# Patient Record
Sex: Female | Born: 1955 | Race: White | Hispanic: No | Marital: Married | Smoking: Never smoker
Health system: Southern US, Community
[De-identification: ages and names within clinical notes are randomized; demographics above are authoritative.]

## PROBLEM LIST (undated history)

## (undated) DIAGNOSIS — G629 Polyneuropathy, unspecified: Secondary | ICD-10-CM

## (undated) DIAGNOSIS — E119 Type 2 diabetes mellitus without complications: Secondary | ICD-10-CM

## (undated) DIAGNOSIS — K219 Gastro-esophageal reflux disease without esophagitis: Secondary | ICD-10-CM

## (undated) DIAGNOSIS — F32A Depression, unspecified: Secondary | ICD-10-CM

## (undated) DIAGNOSIS — I429 Cardiomyopathy, unspecified: Secondary | ICD-10-CM

## (undated) DIAGNOSIS — F329 Major depressive disorder, single episode, unspecified: Secondary | ICD-10-CM

## (undated) DIAGNOSIS — I10 Essential (primary) hypertension: Secondary | ICD-10-CM

## (undated) DIAGNOSIS — M1990 Unspecified osteoarthritis, unspecified site: Secondary | ICD-10-CM

## (undated) DIAGNOSIS — F419 Anxiety disorder, unspecified: Secondary | ICD-10-CM

## (undated) DIAGNOSIS — I2699 Other pulmonary embolism without acute cor pulmonale: Secondary | ICD-10-CM

## (undated) HISTORY — PX: DEBRIDEMENT TENNIS ELBOW: SHX1442

## (undated) HISTORY — PX: TUBAL LIGATION: SHX77

## (undated) HISTORY — PX: BUNIONECTOMY: SHX129

## (undated) HISTORY — PX: TONSILLECTOMY: SUR1361

## (undated) HISTORY — PX: REFRACTIVE SURGERY: SHX103

## (undated) HISTORY — PX: FOOT BONE EXCISION: SUR493

---

## 2002-12-21 ENCOUNTER — Encounter: Payer: Self-pay | Admitting: Physical Medicine and Rehabilitation

## 2010-08-01 DIAGNOSIS — I429 Cardiomyopathy, unspecified: Secondary | ICD-10-CM

## 2010-08-01 HISTORY — DX: Cardiomyopathy, unspecified: I42.9

## 2010-10-13 ENCOUNTER — Encounter: Payer: BC Managed Care – PPO | Attending: Endocrinology | Admitting: *Deleted

## 2010-10-13 DIAGNOSIS — Z713 Dietary counseling and surveillance: Secondary | ICD-10-CM | POA: Insufficient documentation

## 2010-10-13 DIAGNOSIS — E119 Type 2 diabetes mellitus without complications: Secondary | ICD-10-CM | POA: Insufficient documentation

## 2012-10-16 ENCOUNTER — Other Ambulatory Visit: Payer: Self-pay | Admitting: Podiatry

## 2012-10-25 ENCOUNTER — Encounter (HOSPITAL_COMMUNITY)
Admission: RE | Disposition: A | Discharge status: Home / Self Care | Payer: Self-pay | Source: Ambulatory Visit | Attending: Podiatry

## 2012-10-25 ENCOUNTER — Ambulatory Visit (HOSPITAL_COMMUNITY)
Admission: RE | Admit: 2012-10-25 | Discharge: 2012-10-25 | Disposition: A | Discharge status: Home / Self Care | Payer: BC Managed Care – PPO | Source: Ambulatory Visit | Attending: Podiatry | Admitting: Podiatry

## 2012-10-25 DIAGNOSIS — M2040 Other hammer toe(s) (acquired), unspecified foot: Secondary | ICD-10-CM | POA: Insufficient documentation

## 2012-10-25 DIAGNOSIS — M2041 Other hammer toe(s) (acquired), right foot: Secondary | ICD-10-CM

## 2012-10-25 DIAGNOSIS — E1149 Type 2 diabetes mellitus with other diabetic neurological complication: Secondary | ICD-10-CM | POA: Insufficient documentation

## 2012-10-25 DIAGNOSIS — L97509 Non-pressure chronic ulcer of other part of unspecified foot with unspecified severity: Secondary | ICD-10-CM | POA: Insufficient documentation

## 2012-10-25 DIAGNOSIS — E1142 Type 2 diabetes mellitus with diabetic polyneuropathy: Secondary | ICD-10-CM | POA: Insufficient documentation

## 2012-10-25 DIAGNOSIS — A5211 Tabes dorsalis: Secondary | ICD-10-CM | POA: Insufficient documentation

## 2012-10-25 SURGERY — TENOTOMY, FLEXOR
Anesthesia: LOCAL | Laterality: Right

## 2012-10-25 MED ORDER — CLINDAMYCIN PHOSPHATE 600 MG/50ML IV SOLN
INTRAVENOUS | Status: AC
  Filled 2012-10-25: qty 50

## 2012-10-25 MED ORDER — CLINDAMYCIN PHOSPHATE 600 MG/50ML IV SOLN
600.0000 mg | Freq: Once | INTRAVENOUS | Status: AC
  Administered 2012-10-25: 600 mg via INTRAVENOUS

## 2012-10-25 MED ORDER — LACTATED RINGERS IV SOLN
INTRAVENOUS | Status: DC
  Administered 2012-10-25: 09:00:00 via INTRAVENOUS

## 2012-10-25 MED ORDER — BUPIVACAINE HCL (PF) 0.5 % IJ SOLN
INTRAMUSCULAR | Status: AC
  Filled 2012-10-25: qty 30

## 2012-10-25 SURGICAL SUPPLY — 36 items
APL SKNCLS STERI-STRIP NONHPOA (GAUZE/BANDAGES/DRESSINGS)
BAG HAMPER (MISCELLANEOUS) ×1 IMPLANT
BANDAGE ELASTIC 4 VELCRO NS (GAUZE/BANDAGES/DRESSINGS) ×2 IMPLANT
BANDAGE ESMARK 4X12 BL STRL LF (DISPOSABLE) ×1 IMPLANT
BANDAGE GAUZE ELAST BULKY 4 IN (GAUZE/BANDAGES/DRESSINGS) ×2 IMPLANT
BENZOIN TINCTURE PRP APPL 2/3 (GAUZE/BANDAGES/DRESSINGS) ×1 IMPLANT
BLADE SURG 15 STRL LF DISP TIS (BLADE) ×1 IMPLANT
BLADE SURG 15 STRL SS (BLADE)
BNDG CMPR 12X4 ELC STRL LF (DISPOSABLE) ×1
BNDG ESMARK 4X12 BLUE STRL LF (DISPOSABLE) ×2
CHLORAPREP W/TINT 26ML (MISCELLANEOUS) ×1 IMPLANT
CLOTH BEACON ORANGE TIMEOUT ST (SAFETY) ×1 IMPLANT
COVER LIGHT HANDLE STERIS (MISCELLANEOUS) ×2 IMPLANT
CUFF TOURN SGL LL 12 (TOURNIQUET CUFF) ×2 IMPLANT
CUFF TOURNIQUET SINGLE 18IN (TOURNIQUET CUFF) IMPLANT
DECANTER SPIKE VIAL GLASS SM (MISCELLANEOUS) ×2 IMPLANT
DRSG ADAPTIC 3X8 NADH LF (GAUZE/BANDAGES/DRESSINGS) ×2 IMPLANT
ELECT REM PT RETURN 9FT ADLT (ELECTROSURGICAL) ×2
ELECTRODE REM PT RTRN 9FT ADLT (ELECTROSURGICAL) ×1 IMPLANT
GLOVE BIO SURGEON STRL SZ7.5 (GLOVE) ×2 IMPLANT
GOWN STRL REIN XL XLG (GOWN DISPOSABLE) ×3 IMPLANT
KIT ROOM TURNOVER AP CYSTO (KITS) ×1 IMPLANT
MANIFOLD NEPTUNE II (INSTRUMENTS) ×1 IMPLANT
NDL HYPO 27GX1-1/4 (NEEDLE) ×2 IMPLANT
NEEDLE HYPO 27GX1-1/4 (NEEDLE) ×2 IMPLANT
NS IRRIG 1000ML POUR BTL (IV SOLUTION) ×2 IMPLANT
PACK BASIC LIMB (CUSTOM PROCEDURE TRAY) ×1 IMPLANT
PAD ARMBOARD 7.5X6 YLW CONV (MISCELLANEOUS) ×1 IMPLANT
SET BASIN LINEN APH (SET/KITS/TRAYS/PACK) ×2 IMPLANT
SPONGE GAUZE 4X4 12PLY (GAUZE/BANDAGES/DRESSINGS) ×2 IMPLANT
SPONGE LAP 18X18 X RAY DECT (DISPOSABLE) ×2 IMPLANT
STRIP CLOSURE SKIN 1/2X4 (GAUZE/BANDAGES/DRESSINGS) ×4 IMPLANT
SUT PROLENE 4 0 PS 2 18 (SUTURE) ×1 IMPLANT
SUT SILK 4 0 (SUTURE) ×2
SUT SILK 4-0 18XBRD TIE 12 (SUTURE) IMPLANT
SYR CONTROL 10ML LL (SYRINGE) ×2 IMPLANT

## 2012-10-25 NOTE — Op Note (Signed)
OPERATIVE NOTE  DATE OF PROCEDURE:  10/25/2012  SURGEON:   Dallas Schimke, DPM  PREOPERATIVE DIAGNOSIS:   1.  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  Diabetes mellitus with peripheral neuropathy  POSTOPERATIVE DIAGNOSIS: Same  PROCEDURE: 1.  Percutaneous flexor tenotomy 3rd digit right foot 2.  Percutaneous flexor tenotomy 4th digit right foot  ANESTHESIA:  Local   HEMOSTASIS:   Pneumatic ankle tourniquet set at 250 mmHg  ESTIMATED BLOOD LOSS:   Minimal (<5 cc)  MATERIALS USED:  None  INJECTABLES: Marcaine 0.5% plain; 6mL  PATHOLOGY:   None  COMPLICATIONS:   None  INDICATIONS:  Chronic, non-healing ulcerations along the distal aspects of the 3rd digit and 4th digit of the right foot.  The ulceration has failed to remain healed with local wound care and offloading.  DESCRIPTION OF THE PROCEDURE:   The patient was brought to the minor procedure room and placed on the operative table in the supine position.  A pneumatic ankle tourniquet was applied to the patient's ankle.  The surgical site was anesthetized with 0.5% Marcaine plain.  The foot was then prepped, scrubbed, and draped in the usual sterile technique.  The foot was elevated, exsanguinated and the pneumatic ankle tourniquet inflated to 250 mmHg.    Attention was directed to the plantar aspect of the third digit of the right foot.  An incision was made plantar to the DIPJ of the digit.  The flexor tenotomy was identified and transected.  The contracture noted on the distal aspect of the toe preoperatively reduced.  The surgical site was irrigated.  The incision was reapproximated using 4-0 Prolene in a simple suture technique.  Attention was directed to the fourth toe of the right foot.  The same incision was performed to the fourth toe of the right foot that was performed of the third toe of the right foot.  A sterile dressing was applied  to the operative foot.  The pneumatic ankle tourniquet was deflated and a prompt hyperemic response was noted to all digits of the operative foot.    The patient tolerated the procedure well.  The patient was then transferred to Short Stay with vital signs stable and vascular status intact to all toes of the operative foot.  Following a period of postoperative monitoring, the patient will be discharged home.

## 2012-10-29 ENCOUNTER — Encounter (HOSPITAL_COMMUNITY): Payer: Self-pay | Admitting: Anesthesiology

## 2013-07-17 ENCOUNTER — Other Ambulatory Visit: Payer: Self-pay | Admitting: Podiatry

## 2013-07-17 ENCOUNTER — Encounter (HOSPITAL_COMMUNITY): Payer: Self-pay

## 2013-07-17 ENCOUNTER — Ambulatory Visit (HOSPITAL_COMMUNITY)
Admission: RE | Admit: 2013-07-17 | Discharge: 2013-07-17 | Disposition: A | Discharge status: Home / Self Care | Payer: BC Managed Care – PPO | Source: Ambulatory Visit | Attending: Podiatry | Admitting: Podiatry

## 2013-07-17 ENCOUNTER — Encounter (HOSPITAL_COMMUNITY)
Admission: RE | Admit: 2013-07-17 | Discharge: 2013-07-17 | Disposition: A | Discharge status: Home / Self Care | Payer: BC Managed Care – PPO | Source: Ambulatory Visit | Attending: Podiatry | Admitting: Podiatry

## 2013-07-17 HISTORY — DX: Depression, unspecified: F32.A

## 2013-07-17 HISTORY — DX: Unspecified osteoarthritis, unspecified site: M19.90

## 2013-07-17 HISTORY — DX: Gastro-esophageal reflux disease without esophagitis: K21.9

## 2013-07-17 HISTORY — DX: Major depressive disorder, single episode, unspecified: F32.9

## 2013-07-17 HISTORY — DX: Anxiety disorder, unspecified: F41.9

## 2013-07-17 HISTORY — DX: Polyneuropathy, unspecified: G62.9

## 2013-07-17 HISTORY — DX: Essential (primary) hypertension: I10

## 2013-07-17 HISTORY — DX: Type 2 diabetes mellitus without complications: E11.9

## 2013-07-17 LAB — HEMOGLOBIN AND HEMATOCRIT, BLOOD
HCT: 38.2 % (ref 36.0–46.0)
Hemoglobin: 12.5 g/dL (ref 12.0–15.0)

## 2013-07-17 LAB — BASIC METABOLIC PANEL
Calcium: 9.4 mg/dL (ref 8.4–10.5)
GFR calc Af Amer: 70 mL/min — ABNORMAL LOW (ref >90)
GFR calc non Af Amer: 61 mL/min — ABNORMAL LOW (ref >90)
Sodium: 137 mEq/L (ref 135–145)

## 2013-07-17 NOTE — Patient Instructions (Signed)
Natalie Gray  07/17/2013  Your procedure is scheduled on:  07/18/2013  Report to [facility] at  1220 PM.  Call this number if you have problems the morning of surgery: [phone]   Remember:   Do not eat food or drink liquids after midnight.   Take these medicines the morning of surgery with A SIP OF WATER: xanax, cymbalta, norco, wellbutrin, coreg, celebrex, lisinopril, prilosec, topamax   Do not wear jewelry, make-up or nail polish.  Do not wear lotions, powders, or perfumes.   Do not shave 48 hours prior to surgery. Men may shave face and neck.  Do not bring valuables to the hospital.  [facility] is not responsible for any belongings or valuables.               Contacts, dentures or bridgework may not be worn into surgery.  Leave suitcase in the car. After surgery it may be brought to your room.  For patients admitted to the hospital, discharge time is determined by your treatment team.               Patients discharged the day of surgery will not be allowed to drive home.  Name and phone number of your driver: family  Special Instructions: Shower using CHG 2 nights before surgery and the night before surgery.  If you shower the day of surgery use CHG.  Use special wash - you have one bottle of CHG for all showers.  You should use approximately 1/3 of the bottle for each shower.   Please read over the following fact sheets that you were given: Pain Booklet, Coughing and Deep Breathing, MRSA Information, Surgical Site Infection Prevention, Anesthesia Post-op Instructions and Care and Recovery After Surgery Amputation Many new amputations occur each year. The most common causes of amputation of the lower extremity (the hip down) are:  Disease.  Injury caused in an accidents or wars (trauma).  Birth defects.  Lumps (tumors) that are cancer. Upper extremity amputation is usually the result of trauma or birth defect, with disease being a less common cause. COMMON  PROBLEMS After an amputation a number of issues need to be considered. Getting around and self-care are early problems that must be dealt with. A complete rehabilitation program will help the amputee recover mobility. A team approach of caregivers helps the most. Caregivers that can provide a well rounded program include:   Physicians.  Therapists.  Nurses.  Social workers.  Psychologists. Usually there are problems with body image and coping with lifestyle changes. A grieving period similar to dealing with a death in the family is common after an amputation. Talking to a trained professional with experience in treating people with similar problems can be very helpful. When returning to a previous lifestyle, questions about sexuality can arise. Many of these uncertainties are normal. These can be discussed with your psychologist or rehabilitation specialist. REHABILITATION AND RETURN TO WORK AND ACTIVITIES Returning to recreational activities and employment are part of recovery. Many times, changes to recreation equipment can allow return to a sport or hobby. A device that substitutes the missing part of the body is called a prosthetic. Many prosthetic manufacturers produce components designed for sports. Be sure to discuss all of your leisure interests with your prosthetist. This is the person who helps provide you with custom made replacement limbs. Your physician will also help to select a prosthetic that will meet your needs. Employers will vary in their willingness  to change a work environment in order to help people with disabilities. Your therapists can perform job site evaluations. Your therapist can then make recommendations to help with your work area. Some amputees will not be able to return to previous jobs. Your local Office of Vocational Rehabilitation can assist you in job retraining.  Once you are past the initial rehabilitation stage you will have ongoing contact with caregivers and  a prosthetist. You need to work closely with them in making decisions about your prosthetic device. PROGNOSIS  Amputation should not end your joy of life. There are people with limb loss in nearly all walks of life. They are in a wide variety of professions. They participate in nearly all sports. Ask your caregivers about support groups and sports organizations in your area. They can help you with referral to organizations that will be helpful to you. Document Released: 04/09/2002 Document Revised: 10/10/2011 Document Reviewed: 06/04/2007 [facility] Patient Information 2014 [place], [place]. PATIENT INSTRUCTIONS POST-ANESTHESIA  IMMEDIATELY FOLLOWING SURGERY:  Do not drive or operate machinery for the first twenty four hours after surgery.  Do not make any important decisions for twenty four hours after surgery or while taking narcotic pain medications or sedatives.  If you develop intractable nausea and vomiting or a severe headache please notify your doctor immediately.  FOLLOW-UP:  Please make an appointment with your surgeon as instructed. You do not need to follow up with anesthesia unless specifically instructed to do so.  WOUND CARE INSTRUCTIONS (if applicable):  Keep a dry clean dressing on the anesthesia/puncture wound site if there is drainage.  Once the wound has quit draining you may leave it open to air.  Generally you should leave the bandage intact for twenty four hours unless there is drainage.  If the epidural site drains for more than 36-48 hours please call the anesthesia department.  QUESTIONS?:  Please feel free to call your physician or the hospital operator if you have any questions, and they will be happy to assist you.

## 2013-07-18 ENCOUNTER — Ambulatory Visit (HOSPITAL_COMMUNITY): Payer: BC Managed Care – PPO

## 2013-07-18 ENCOUNTER — Encounter (HOSPITAL_COMMUNITY)
Admission: RE | Disposition: A | Discharge status: Home / Self Care | Payer: Self-pay | Source: Ambulatory Visit | Attending: Podiatry

## 2013-07-18 ENCOUNTER — Encounter (HOSPITAL_COMMUNITY): Payer: BC Managed Care – PPO | Admitting: Anesthesiology

## 2013-07-18 ENCOUNTER — Encounter (HOSPITAL_COMMUNITY): Payer: Self-pay | Admitting: *Deleted

## 2013-07-18 ENCOUNTER — Ambulatory Visit (HOSPITAL_COMMUNITY): Payer: BC Managed Care – PPO | Admitting: Anesthesiology

## 2013-07-18 ENCOUNTER — Ambulatory Visit (HOSPITAL_COMMUNITY)
Admission: RE | Admit: 2013-07-18 | Discharge: 2013-07-18 | Disposition: A | Discharge status: Home / Self Care | Payer: BC Managed Care – PPO | Source: Ambulatory Visit | Attending: Podiatry | Admitting: Podiatry

## 2013-07-18 DIAGNOSIS — Z01810 Encounter for preprocedural cardiovascular examination: Secondary | ICD-10-CM | POA: Insufficient documentation

## 2013-07-18 DIAGNOSIS — Z79899 Other long term (current) drug therapy: Secondary | ICD-10-CM | POA: Insufficient documentation

## 2013-07-18 DIAGNOSIS — T8489XA Other specified complication of internal orthopedic prosthetic devices, implants and grafts, initial encounter: Secondary | ICD-10-CM | POA: Insufficient documentation

## 2013-07-18 DIAGNOSIS — Y838 Other surgical procedures as the cause of abnormal reaction of the patient, or of later complication, without mention of misadventure at the time of the procedure: Secondary | ICD-10-CM | POA: Insufficient documentation

## 2013-07-18 DIAGNOSIS — Z01812 Encounter for preprocedural laboratory examination: Secondary | ICD-10-CM | POA: Insufficient documentation

## 2013-07-18 DIAGNOSIS — Z472 Encounter for removal of internal fixation device: Secondary | ICD-10-CM | POA: Insufficient documentation

## 2013-07-18 DIAGNOSIS — I10 Essential (primary) hypertension: Secondary | ICD-10-CM | POA: Insufficient documentation

## 2013-07-18 DIAGNOSIS — IMO0002 Reserved for concepts with insufficient information to code with codable children: Secondary | ICD-10-CM

## 2013-07-18 DIAGNOSIS — E119 Type 2 diabetes mellitus without complications: Secondary | ICD-10-CM | POA: Insufficient documentation

## 2013-07-18 DIAGNOSIS — L97509 Non-pressure chronic ulcer of other part of unspecified foot with unspecified severity: Secondary | ICD-10-CM | POA: Insufficient documentation

## 2013-07-18 HISTORY — PX: FOOT ARTHRODESIS: SHX1655

## 2013-07-18 HISTORY — PX: AMPUTATION: SHX166

## 2013-07-18 LAB — GLUCOSE, CAPILLARY: Glucose-Capillary: 104 mg/dL — ABNORMAL HIGH (ref 70–99)

## 2013-07-18 SURGERY — AMPUTATION DIGIT
Anesthesia: Monitor Anesthesia Care | Site: Foot | Laterality: Right

## 2013-07-18 MED ORDER — CLINDAMYCIN PHOSPHATE 600 MG/50ML IV SOLN: 600.0000 mg | Freq: Once | INTRAVENOUS | Status: DC

## 2013-07-18 MED ORDER — LIDOCAINE HCL (PF) 1 % IJ SOLN
INTRAMUSCULAR | Status: AC
  Filled 2013-07-18: qty 2

## 2013-07-18 MED ORDER — MIDAZOLAM HCL 2 MG/2ML IJ SOLN
INTRAMUSCULAR | Status: AC
  Filled 2013-07-18: qty 2

## 2013-07-18 MED ORDER — PROPOFOL 10 MG/ML IV BOLUS
INTRAVENOUS | Status: AC
  Filled 2013-07-18: qty 20

## 2013-07-18 MED ORDER — ONDANSETRON HCL 4 MG/2ML IJ SOLN: 4.0000 mg | Freq: Once | INTRAMUSCULAR | Status: DC | PRN

## 2013-07-18 MED ORDER — LACTATED RINGERS IV SOLN
INTRAVENOUS | Status: DC
  Administered 2013-07-18: 16:00:00 via INTRAVENOUS
  Administered 2013-07-18: 1000 mL via INTRAVENOUS

## 2013-07-18 MED ORDER — ONDANSETRON HCL 4 MG/2ML IJ SOLN
4.0000 mg | Freq: Once | INTRAMUSCULAR | Status: AC
  Administered 2013-07-18: 4 mg via INTRAVENOUS

## 2013-07-18 MED ORDER — BUPIVACAINE HCL (PF) 0.5 % IJ SOLN
INTRAMUSCULAR | Status: AC
  Filled 2013-07-18: qty 30

## 2013-07-18 MED ORDER — MIDAZOLAM HCL 2 MG/2ML IJ SOLN
1.0000 mg | INTRAMUSCULAR | Status: DC | PRN
  Administered 2013-07-18: 2 mg via INTRAVENOUS

## 2013-07-18 MED ORDER — BUPIVACAINE HCL (PF) 0.5 % IJ SOLN
INTRAMUSCULAR | Status: DC | PRN
  Administered 2013-07-18: 15 mL

## 2013-07-18 MED ORDER — CLINDAMYCIN PHOSPHATE 600 MG/50ML IV SOLN
600.0000 mg | Freq: Once | INTRAVENOUS | Status: AC
  Administered 2013-07-18: 600 mg via INTRAVENOUS
  Filled 2013-07-18: qty 50

## 2013-07-18 MED ORDER — ONDANSETRON HCL 4 MG/2ML IJ SOLN
INTRAMUSCULAR | Status: AC
  Filled 2013-07-18: qty 2

## 2013-07-18 MED ORDER — 0.9 % SODIUM CHLORIDE (POUR BTL) OPTIME
TOPICAL | Status: DC | PRN
  Administered 2013-07-18: 1000 mL

## 2013-07-18 MED ORDER — FENTANYL CITRATE 0.05 MG/ML IJ SOLN
INTRAMUSCULAR | Status: AC
  Filled 2013-07-18: qty 2

## 2013-07-18 MED ORDER — FENTANYL CITRATE 0.05 MG/ML IJ SOLN: 25.0000 ug | INTRAMUSCULAR | Status: DC | PRN

## 2013-07-18 MED ORDER — LIDOCAINE HCL (PF) 1 % IJ SOLN
INTRAMUSCULAR | Status: AC
  Filled 2013-07-18: qty 5

## 2013-07-18 MED ORDER — LIDOCAINE HCL (CARDIAC) 10 MG/ML IV SOLN
INTRAVENOUS | Status: DC | PRN
  Administered 2013-07-18: 10 mg via INTRAVENOUS

## 2013-07-18 MED ORDER — FENTANYL CITRATE 0.05 MG/ML IJ SOLN
INTRAMUSCULAR | Status: DC | PRN
  Administered 2013-07-18 (×2): 25 ug via INTRAVENOUS

## 2013-07-18 MED ORDER — FENTANYL CITRATE 0.05 MG/ML IJ SOLN
25.0000 ug | INTRAMUSCULAR | Status: AC
  Administered 2013-07-18 (×2): 25 ug via INTRAVENOUS

## 2013-07-18 MED ORDER — PROPOFOL INFUSION 10 MG/ML OPTIME
INTRAVENOUS | Status: DC | PRN
  Administered 2013-07-18: 100 ug/kg/min via INTRAVENOUS
  Administered 2013-07-18 (×2): via INTRAVENOUS

## 2013-07-18 SURGICAL SUPPLY — 55 items
APL SKNCLS STERI-STRIP NONHPOA (GAUZE/BANDAGES/DRESSINGS) ×1
BAG HAMPER (MISCELLANEOUS) ×2 IMPLANT
BANDAGE CONFORM 2  STR LF (GAUZE/BANDAGES/DRESSINGS) ×2 IMPLANT
BANDAGE ELASTIC 4 VELCRO NS (GAUZE/BANDAGES/DRESSINGS) ×2 IMPLANT
BANDAGE ESMARK 4X12 BL STRL LF (DISPOSABLE) ×1 IMPLANT
BANDAGE GAUZE ELAST BULKY 4 IN (GAUZE/BANDAGES/DRESSINGS) ×2 IMPLANT
BENZOIN TINCTURE PRP APPL 2/3 (GAUZE/BANDAGES/DRESSINGS) ×2 IMPLANT
BLADE AVERAGE 25X9 (BLADE) ×2 IMPLANT
BLADE SURG 15 STRL LF DISP TIS (BLADE) ×2 IMPLANT
BLADE SURG 15 STRL SS (BLADE) ×4
BNDG CMPR 12X4 ELC STRL LF (DISPOSABLE) ×1
BNDG ESMARK 4X12 BLUE STRL LF (DISPOSABLE) ×2
CHLORAPREP W/TINT 26ML (MISCELLANEOUS) ×1 IMPLANT
CLOTH BEACON ORANGE TIMEOUT ST (SAFETY) ×2 IMPLANT
COVER LIGHT HANDLE STERIS (MISCELLANEOUS) ×4 IMPLANT
CUFF TOURNIQUET SINGLE 18IN (TOURNIQUET CUFF) ×2 IMPLANT
DECANTER SPIKE VIAL GLASS SM (MISCELLANEOUS) ×2 IMPLANT
DRAPE OEC MINIVIEW 54X84 (DRAPES) ×2 IMPLANT
DRSG ADAPTIC 3X8 NADH LF (GAUZE/BANDAGES/DRESSINGS) ×2 IMPLANT
DURA STEPPER LG (CAST SUPPLIES) IMPLANT
DURA STEPPER MED (CAST SUPPLIES) ×1 IMPLANT
DURA STEPPER SML (CAST SUPPLIES) IMPLANT
DURA STEPPER XL (SOFTGOODS) IMPLANT
ELECT REM PT RETURN 9FT ADLT (ELECTROSURGICAL) ×2
ELECTRODE REM PT RTRN 9FT ADLT (ELECTROSURGICAL) ×1 IMPLANT
GLOVE BIO SURGEON STRL SZ7.5 (GLOVE) ×2 IMPLANT
GLOVE EXAM NITRILE MD LF STRL (GLOVE) ×1 IMPLANT
GLOVE INDICATOR 6.5 STRL GRN (GLOVE) ×1 IMPLANT
GLOVE INDICATOR 7.0 STRL GRN (GLOVE) ×2 IMPLANT
GLOVE SS BIOGEL STRL SZ 6.5 (GLOVE) IMPLANT
GLOVE SUPERSENSE BIOGEL SZ 6.5 (GLOVE) ×1
GOWN STRL REIN XL XLG (GOWN DISPOSABLE) ×6 IMPLANT
K-WIRE NON THREAD 1.1 (WIRE) ×2
KIT ROOM TURNOVER AP CYSTO (KITS) ×2 IMPLANT
KIT ROOM TURNOVER APOR (KITS) ×2 IMPLANT
KWIRE NON THREAD 1.1 (WIRE) IMPLANT
MANIFOLD NEPTUNE II (INSTRUMENTS) ×2 IMPLANT
NDL HYPO 27GX1-1/4 (NEEDLE) ×3 IMPLANT
NEEDLE HYPO 27GX1-1/4 (NEEDLE) ×6 IMPLANT
NS IRRIG 1000ML POUR BTL (IV SOLUTION) ×2 IMPLANT
PACK BASIC LIMB (CUSTOM PROCEDURE TRAY) ×2 IMPLANT
PAD ARMBOARD 7.5X6 YLW CONV (MISCELLANEOUS) ×2 IMPLANT
RASP SM TEAR CROSS CUT (RASP) IMPLANT
SET BASIN LINEN APH (SET/KITS/TRAYS/PACK) ×2 IMPLANT
SOL PREP PROV IODINE SCRUB 4OZ (MISCELLANEOUS) ×2 IMPLANT
SPONGE GAUZE 4X4 12PLY (GAUZE/BANDAGES/DRESSINGS) ×2 IMPLANT
SPONGE LAP 18X18 X RAY DECT (DISPOSABLE) ×2 IMPLANT
STAPLE FIXATION 8X8X8 (Staple) ×2 IMPLANT
STRIP CLOSURE SKIN 1/2X4 (GAUZE/BANDAGES/DRESSINGS) ×3 IMPLANT
SUT ETHILON 4 0 PS 2 18 (SUTURE) IMPLANT
SUT PROLENE 4 0 PS 2 18 (SUTURE) ×4 IMPLANT
SUT VIC AB 2-0 CT2 27 (SUTURE) ×1 IMPLANT
SUT VIC AB 4-0 PS2 27 (SUTURE) ×2 IMPLANT
SUT VICRYL AB 3-0 FS1 BRD 27IN (SUTURE) ×1 IMPLANT
SYR CONTROL 10ML LL (SYRINGE) ×4 IMPLANT

## 2013-07-18 NOTE — H&P (Signed)
HISTORY AND PHYSICAL INTERVAL NOTE:  07/18/2013  1:55 PM  Natalie Gray  has presented today for surgery, with the diagnosis of ulceration 2nd toe right foot, non-union IPJ right hallux.  The various methods of treatment have been discussed with the patient.  No guarantees were given.  After consideration of risks, benefits and other options for treatment, the patient has consented to surgery.  I have reviewed the patients' chart and labs.    Patient Vitals for the past 24 hrs:  BP Temp Temp src Pulse Resp SpO2  07/18/13 1325 109/72 mmHg - - - 20 98 %  07/18/13 1320 118/76 mmHg - - - 15 99 %  07/18/13 1315 117/75 mmHg - - - 18 99 %  07/18/13 1310 110/75 mmHg - - - 16 97 %  07/18/13 1305 115/78 mmHg - - - 17 100 %  07/18/13 1300 120/72 mmHg - - - 18 99 %  07/18/13 1255 113/76 mmHg - - - 21 100 %  07/18/13 1250 117/76 mmHg - - - 14 97 %  07/18/13 1245 105/61 mmHg - - - 17 99 %  07/18/13 1240 112/71 mmHg - - - 14 96 %  07/18/13 1235 114/76 mmHg 97.6 F (36.4 C) Oral 64 20 100 %    A history and physical examination was performed in my office.  The patient was reexamined.  There have been no changes to this history and physical examination.  Dallas Schimke, DPM

## 2013-07-18 NOTE — Anesthesia Preprocedure Evaluation (Signed)
Anesthesia Evaluation  Patient identified by MRN, date of birth, ID band Patient awake    Reviewed: Allergy & Precautions, H&P , NPO status , Patient's Chart, lab work & pertinent test results, reviewed documented beta blocker date and time   Airway Mallampati: II  TM Distance: >3 FB     Dental  (+) Teeth Intact   Pulmonary neg pulmonary ROS,  Hx pulm emboli, resolved. Not anticoagulated now. breath sounds clear to auscultation        Cardiovascular hypertension, Pt. on medications and Pt. on home beta blockers +CHF (cardiomyopathy of unknown etiology, EF recovered from 10% to 45% rently.) and DVT Rhythm:Regular Rate:Normal     Neuro/Psych PSYCHIATRIC DISORDERS Anxiety Depression    GI/Hepatic GERD-  Controlled and Medicated,  Endo/Other  diabetes, Well Controlled, Type 2, Oral Hypoglycemic Agents  Renal/GU      Musculoskeletal   Abdominal   Peds  Hematology   Anesthesia Other Findings   Reproductive/Obstetrics                             Anesthesia Physical Anesthesia Plan  ASA: III  Anesthesia Plan: MAC   Post-op Pain Management:    Induction: Intravenous  Airway Management Planned: Nasal Cannula  Additional Equipment:   Intra-op Plan:   Post-operative Plan:   Informed Consent: I have reviewed the patients History and Physical, chart, labs and discussed the procedure including the risks, benefits and alternatives for the proposed anesthesia with the patient or authorized representative who has indicated his/her understanding and acceptance.     Plan Discussed with:   Anesthesia Plan Comments:         Anesthesia Quick Evaluation  

## 2013-07-18 NOTE — Op Note (Signed)
OPERATIVE NOTE  DATE OF PROCEDURE:  07/18/2013  SURGEON:   Dallas Schimke, DPM  OR STAFF:   Circulator: Eliane Decree Page, RN Scrub Person: Hurshel Party, CST Circulator Assistant: Lennox Pippins, RN RN First Assistant: Lizabeth Leyden, RN   PREOPERATIVE DIAGNOSIS:   1.  Ulceration 2nd toe, right foot. 2.  Retained hardware of the hallux, right foot. 3.  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 4.  Diabetes mellitus with peripheral neuropathy.  POSTOPERATIVE DIAGNOSIS: Same  PROCEDURE: 1.  Amputation of the 2nd toe at the metatarsophalangeal joint, right foot. 2.  Removal of hardware of the hallux, right foot. 3.  Repair of non-union of the IPJ of the hallux, right foot.  ANESTHESIA:  Monitor Anesthesia Care   HEMOSTASIS:   Pneumatic ankle tourniquet set at 250 mmHg  ESTIMATED BLOOD LOSS:   Minimal (<5 cc)  MATERIALS USED:  Stryker staple  INJECTABLES: Marcaine 0.5% plain; 15mL  PATHOLOGY:   Right 2nd toe was provided to the patient at her request for burial.    COMPLICATIONS:   None  INDICATIONS:  Chronic non-healing wound of the right 2nd toe.  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  DESCRIPTION OF THE PROCEDURE:   The patient was brought to the operating room and placed on the operative table in the supine position.  A pneumatic ankle tourniquet was applied to the patient's ankle.  Following sedation, the surgical site was anesthetized with 0.5% Marcaine plain.  The foot was then prepped, scrubbed, and draped in the usual sterile technique.  The foot was elevated, exsanguinated and the pneumatic ankle tourniquet inflated to 250 mmHg.    Attention was directed to the second toe of the right foot.  A full-thickness ulceration was noted along the medial aspect of the second toe of the right foot.  Two converging semielliptical incisions were made encompassing the second toe in its entirety.  Dissection was continued deep down to the level of  the second metatarsophalangeal joint.  The second metatarsophalangeal joint was disarticulated and the second toe was removed and passed from the operative field.  It was provided to the patient in a sterile cup for burial as requested.  The wound was irrigated with copious amounts of sterile irrigant.  Subcutaneous structures were reapproximated using 4-0 Vicryl running simple suture technique.  The skin was reapproximated using 4-0 Prolene in a simple suture and horizontal mattress simple suture technique.  Attention was directed to the distal aspect of the right hallux where a full-thickness ulceration was present.  2 converging semi-optical incisions were performed encompassing the ulceration in its entirety.  This wedge of skin was removed and passed from the operative field.  Dissection was continued deep down to the level of the distal phalanx.  The screw crossing the interphalangeal joint of the hallux was identified.  It was removed and passed from the operative field.  Attention was then directed to the dorsal aspect of the right interphalangeal joint of the right hallux.  A linear longitudinal incision was made.  Dissection was continued deep down to the level of the interphalangeal joint.  Significant fibrous tissue was identified at the interphalangeal joint consistent with a nonunion site.  This was resected using rongeur and power saw.  The distal phalanx and proximal phalanx were debrided to a bleeding base.  A staple was placed along the lateral aspect of the interphalangeal joint.  However, this did not provide adequate compression and pulled  through the distal phalanx.  This was removed and passed from the operative field.  A second staple was inserted across the medial portion of the interphalangeal joint.  Good compression was identified.  The wound was irrigated with copious amounts sterile irrigant.  The subcutaneous structures reapproximated using 4-0 Vicryl in a simple suture technique.   The skin was reapproximated using 4-0 Prolene in a simple suture technique.  A sterile compressive dressing was applied to the right foot.  The pneumatic ankle tourniquet was deflated and a prompt hyperemic response was noted to all remaining digits of the right foot.  The patient tolerated the procedure well.  The patient was then transferred to PACU with vital signs stable and vascular status intact to all toes of the operative foot.  Following a period of postoperative monitoring, the patient will be discharged home.

## 2013-07-18 NOTE — Anesthesia Postprocedure Evaluation (Signed)
  Anesthesia Post-op Note  Patient: Natalie Gray  Procedure(s) Performed: Procedure(s): AMPUTATION 2ND TOE RIGHT FOOT  (Right) REPAIR IPJ NON-UNION RIGHT HALLUX (Right)  Patient Location: PACU  Anesthesia Type:MAC  Level of Consciousness: awake, alert  and oriented  Airway and Oxygen Therapy: Patient Spontanous Breathing  Post-op Pain: none  Post-op Assessment: Post-op Vital signs reviewed, Patient's Cardiovascular Status Stable, Respiratory Function Stable, Patent Airway and No signs of Nausea or vomiting  Post-op Vital Signs: Reviewed and stable  Complications: No apparent anesthesia complications

## 2013-07-18 NOTE — Anesthesia Procedure Notes (Signed)
Procedure Name: MAC Date/Time: 07/18/2013 2:15 PM Performed by: Franco Nones Pre-anesthesia Checklist: Patient identified, Emergency Drugs available, Suction available, Timeout performed and Patient being monitored Patient Re-evaluated:Patient Re-evaluated prior to inductionOxygen Delivery Method: Non-rebreather mask

## 2013-07-18 NOTE — Transfer of Care (Signed)
Immediate Anesthesia Transfer of Care Note  Patient: Natalie Gray  Procedure(s) Performed: Procedure(s): AMPUTATION 2ND TOE RIGHT FOOT  (Right) REPAIR IPJ NON-UNION RIGHT HALLUX (Right)  Patient Location: PACU  Anesthesia Type:MAC  Level of Consciousness: awake, alert  and oriented  Airway & Oxygen Therapy: Patient Spontanous Breathing  Post-op Assessment: Report given to PACU RN  Post vital signs: Reviewed  Complications: No apparent anesthesia complications

## 2013-07-18 NOTE — Addendum Note (Signed)
Addended by: Doral Ventrella on: 07/18/2013 07:30 AM   Modules accepted: Orders  

## 2013-07-22 ENCOUNTER — Encounter (HOSPITAL_COMMUNITY): Payer: Self-pay | Admitting: Podiatry

## 2014-06-02 ENCOUNTER — Other Ambulatory Visit: Payer: Self-pay | Admitting: Podiatry

## 2014-06-06 NOTE — Patient Instructions (Signed)
Natalie Gray  06/06/2014   Your procedure is scheduled on:   06/11/2014  Report to [facility] at  850  AM.  Call this number if you have problems the morning of surgery: [phone]   Remember:   Do not eat food or drink liquids after midnight.   Take these medicines the morning of surgery with A SIP OF WATER:  Xanax, cymbalta, hydrocodone, coreg, celebrex, nexium, lisinopril, topamax   Do not wear jewelry, make-up or nail polish.  Do not wear lotions, powders, or perfumes. You may wear deodorant.  Do not shave 48 hours prior to surgery. Men may shave face and neck.  Do not bring valuables to the hospital.  [facility] any belongings or valuables.               Contacts, dentures or bridgework may not be worn into surgery.  Leave suitcase in the car. After surgery it may be brought to your room.  For patients admitted to the hospital, discharge time is determined by your treatment team.               Patients discharged the day of surgery will not be allowed to drive home.  Name and phone number of your driver: family  Special Instructions: Shower using CHG 2 nights before surgery and the night before surgery.  If you shower the day of surgery use CHG.  Use special wash - you have one bottle of CHG for all showers.  You should use approximately 1/3 of the bottle for each shower.   Please read over the following fact sheets that you were given: Pain Booklet, Coughing and Deep Breathing, Surgical Site Infection Prevention, Anesthesia Post-op Instructions and Care and Recovery After Surgery Toe Injuries and Amputations You have cut off (amputated) part of your toe. Your outcome depends largely on how much was amputated. If just the tip is amputated, often the end of the toe will grow back and the toe may return much to the same as it was before the injury. If more of the toe is missing, your caregiver has done the best with the tissue remaining to allow you  to keep as much toe as is possible or has finished the amputation at a level that will leave you with the most functional toe. This means a toe that will work the best for you. Please read the instructions outlined below and refer to this sheet in the next few weeks. These instructions provide you with general information on caring for yourself. Your caregiver may also give you specific instructions. While your treatment has been done according to the most current medical practices available, unavoidable complications occasionally occur. If you have any problems or questions after discharge, call your caregiver. HOME CARE INSTRUCTIONS   You may resume a normal diet and activities as directed or allowed.  Keep your foot elevated when possible. This helps decrease pain and swelling.  Keep ice packs (a bag of ice wrapped in a towel) on the injured area for 15-20 minutes, 03-04 times per day, for the first two days. Use ice only if OK with your caregiver.  Change dressings if necessary or as directed.  Clean the wounded area as directed.  Only take over-the-counter or prescription medicines for pain, discomfort, or fever as directed by your caregiver.  Keep appointments as directed. SEEK IMMEDIATE MEDICAL CARE IF:  There is redness, swelling, numbness or increasing pain  in the wound.  There is pus coming from wound.  You have an unexplained oral temperature above 102 F (38.9 C) or as your caregiver suggests.  There is a bad (foul) smell coming from the wound or dressing.  The edges of the wound break open (the edges are not staying together) after sutures or staples have been removed. Document Released: 06/08/2005 Document Revised: 10/10/2011 Document Reviewed: 11/05/2008 [facility] Patient Information 2015 [employer] [place]. This information is not intended to replace advice given to you by your health care provider. Make sure you discuss any questions you have with your health care  provider. PATIENT INSTRUCTIONS POST-ANESTHESIA  IMMEDIATELY FOLLOWING SURGERY:  Do not drive or operate machinery for the first twenty four hours after surgery.  Do not make any important decisions for twenty four hours after surgery or while taking narcotic pain medications or sedatives.  If you develop intractable nausea and vomiting or a severe headache please notify your doctor immediately.  FOLLOW-UP:  Please make an appointment with your surgeon as instructed. You do not need to follow up with anesthesia unless specifically instructed to do so.  WOUND CARE INSTRUCTIONS (if applicable):  Keep a dry clean dressing on the anesthesia/puncture wound site if there is drainage.  Once the wound has quit draining you may leave it open to air.  Generally you should leave the bandage intact for twenty four hours unless there is drainage.  If the epidural site drains for more than 36-48 hours please call the anesthesia department.  QUESTIONS?:  Please feel free to call your physician or the hospital operator if you have any questions, and they will be happy to assist you.

## 2014-06-09 ENCOUNTER — Encounter (HOSPITAL_COMMUNITY): Payer: Self-pay

## 2014-06-09 ENCOUNTER — Encounter (HOSPITAL_COMMUNITY)
Admission: RE | Admit: 2014-06-09 | Discharge: 2014-06-09 | Disposition: A | Discharge status: Home / Self Care | Payer: BC Managed Care – PPO | Source: Ambulatory Visit | Attending: Podiatry | Admitting: Podiatry

## 2014-06-09 ENCOUNTER — Other Ambulatory Visit: Payer: Self-pay

## 2014-06-09 DIAGNOSIS — E1340 Other specified diabetes mellitus with diabetic neuropathy, unspecified: Secondary | ICD-10-CM | POA: Diagnosis not present

## 2014-06-09 DIAGNOSIS — F418 Other specified anxiety disorders: Secondary | ICD-10-CM | POA: Diagnosis not present

## 2014-06-09 DIAGNOSIS — K219 Gastro-esophageal reflux disease without esophagitis: Secondary | ICD-10-CM | POA: Diagnosis not present

## 2014-06-09 DIAGNOSIS — L97512 Non-pressure chronic ulcer of other part of right foot with fat layer exposed: Secondary | ICD-10-CM | POA: Diagnosis present

## 2014-06-09 DIAGNOSIS — I10 Essential (primary) hypertension: Secondary | ICD-10-CM | POA: Diagnosis not present

## 2014-06-09 HISTORY — DX: Other pulmonary embolism without acute cor pulmonale: I26.99

## 2014-06-09 LAB — BASIC METABOLIC PANEL
ANION GAP: 11 (ref 5–15)
BUN: 33 mg/dL — AB (ref 6–23)
CHLORIDE: 101 meq/L (ref 96–112)
CO2: 25 mEq/L (ref 19–32)
Calcium: 8.8 mg/dL (ref 8.4–10.5)
Creatinine, Ser: 1.46 mg/dL — ABNORMAL HIGH (ref 0.50–1.10)
GFR calc Af Amer: 45 mL/min — ABNORMAL LOW (ref >90)
GFR, EST NON AFRICAN AMERICAN: 39 mL/min — AB (ref >90)
Glucose, Bld: 159 mg/dL — ABNORMAL HIGH (ref 70–99)
POTASSIUM: 4.7 meq/L (ref 3.7–5.3)
SODIUM: 137 meq/L (ref 137–147)

## 2014-06-09 LAB — HEMOGLOBIN AND HEMATOCRIT, BLOOD
HEMATOCRIT: 40.3 % (ref 36.0–46.0)
HEMOGLOBIN: 13.3 g/dL (ref 12.0–15.0)

## 2014-06-09 NOTE — Pre-Procedure Instructions (Signed)
Patient given information to sign up for my chart at home. 

## 2014-06-11 ENCOUNTER — Ambulatory Visit (HOSPITAL_COMMUNITY): Payer: BC Managed Care – PPO | Admitting: Anesthesiology

## 2014-06-11 ENCOUNTER — Encounter (HOSPITAL_COMMUNITY): Payer: Self-pay | Admitting: *Deleted

## 2014-06-11 ENCOUNTER — Ambulatory Visit (HOSPITAL_COMMUNITY)
Admission: RE | Admit: 2014-06-11 | Discharge: 2014-06-11 | Disposition: A | Discharge status: Home / Self Care | Payer: BC Managed Care – PPO | Source: Ambulatory Visit | Attending: Podiatry | Admitting: Podiatry

## 2014-06-11 ENCOUNTER — Ambulatory Visit (HOSPITAL_COMMUNITY): Payer: BC Managed Care – PPO

## 2014-06-11 ENCOUNTER — Encounter (HOSPITAL_COMMUNITY)
Admission: RE | Disposition: A | Discharge status: Home / Self Care | Payer: Self-pay | Source: Ambulatory Visit | Attending: Podiatry

## 2014-06-11 DIAGNOSIS — E1340 Other specified diabetes mellitus with diabetic neuropathy, unspecified: Secondary | ICD-10-CM | POA: Insufficient documentation

## 2014-06-11 DIAGNOSIS — Z9889 Other specified postprocedural states: Secondary | ICD-10-CM

## 2014-06-11 DIAGNOSIS — L97512 Non-pressure chronic ulcer of other part of right foot with fat layer exposed: Secondary | ICD-10-CM | POA: Diagnosis not present

## 2014-06-11 DIAGNOSIS — F418 Other specified anxiety disorders: Secondary | ICD-10-CM | POA: Insufficient documentation

## 2014-06-11 DIAGNOSIS — K219 Gastro-esophageal reflux disease without esophagitis: Secondary | ICD-10-CM | POA: Insufficient documentation

## 2014-06-11 DIAGNOSIS — I10 Essential (primary) hypertension: Secondary | ICD-10-CM | POA: Insufficient documentation

## 2014-06-11 HISTORY — PX: AMPUTATION: SHX166

## 2014-06-11 LAB — GLUCOSE, CAPILLARY
GLUCOSE-CAPILLARY: 113 mg/dL — AB (ref 70–99)
Glucose-Capillary: 126 mg/dL — ABNORMAL HIGH (ref 70–99)

## 2014-06-11 SURGERY — AMPUTATION DIGIT
Anesthesia: Monitor Anesthesia Care | Site: Toe | Laterality: Right

## 2014-06-11 MED ORDER — SCOPOLAMINE 1 MG/3DAYS TD PT72
1.0000 | MEDICATED_PATCH | Freq: Once | TRANSDERMAL | Status: DC
  Administered 2014-06-11: 1.5 mg via TRANSDERMAL
  Filled 2014-06-11: qty 1

## 2014-06-11 MED ORDER — ONDANSETRON HCL 4 MG/2ML IJ SOLN: 4.0000 mg | Freq: Once | INTRAMUSCULAR | Status: DC | PRN

## 2014-06-11 MED ORDER — PROPOFOL 10 MG/ML IV EMUL
INTRAVENOUS | Status: AC
  Filled 2014-06-11: qty 20

## 2014-06-11 MED ORDER — MIDAZOLAM HCL 5 MG/5ML IJ SOLN
INTRAMUSCULAR | Status: DC | PRN
  Administered 2014-06-11 (×2): 1 mg via INTRAVENOUS

## 2014-06-11 MED ORDER — LACTATED RINGERS IV SOLN
INTRAVENOUS | Status: DC
  Administered 2014-06-11: 10:00:00 via INTRAVENOUS

## 2014-06-11 MED ORDER — LIDOCAINE HCL 1 % IJ SOLN
INTRAMUSCULAR | Status: DC | PRN
  Administered 2014-06-11: 20 mg via INTRADERMAL

## 2014-06-11 MED ORDER — FENTANYL CITRATE 0.05 MG/ML IJ SOLN
25.0000 ug | INTRAMUSCULAR | Status: AC
  Administered 2014-06-11 (×2): 25 ug via INTRAVENOUS
  Filled 2014-06-11: qty 2

## 2014-06-11 MED ORDER — MIDAZOLAM HCL 2 MG/2ML IJ SOLN
INTRAMUSCULAR | Status: AC
  Filled 2014-06-11: qty 2

## 2014-06-11 MED ORDER — ONDANSETRON HCL 4 MG/2ML IJ SOLN
4.0000 mg | Freq: Once | INTRAMUSCULAR | Status: AC
  Administered 2014-06-11: 4 mg via INTRAVENOUS
  Filled 2014-06-11: qty 2

## 2014-06-11 MED ORDER — BUPIVACAINE HCL (PF) 0.5 % IJ SOLN
INTRAMUSCULAR | Status: DC | PRN
  Administered 2014-06-11: 20 mL

## 2014-06-11 MED ORDER — PROPOFOL INFUSION 10 MG/ML OPTIME
INTRAVENOUS | Status: DC | PRN
  Administered 2014-06-11: 100 ug/kg/min via INTRAVENOUS

## 2014-06-11 MED ORDER — PROPOFOL 10 MG/ML IV BOLUS
INTRAVENOUS | Status: DC | PRN
  Administered 2014-06-11: 10 mg via INTRAVENOUS

## 2014-06-11 MED ORDER — 0.9 % SODIUM CHLORIDE (POUR BTL) OPTIME
TOPICAL | Status: DC | PRN
  Administered 2014-06-11: 1000 mL

## 2014-06-11 MED ORDER — FENTANYL CITRATE 0.05 MG/ML IJ SOLN: 25.0000 ug | INTRAMUSCULAR | Status: DC | PRN

## 2014-06-11 MED ORDER — CLINDAMYCIN PHOSPHATE 600 MG/50ML IV SOLN
600.0000 mg | Freq: Once | INTRAVENOUS | Status: AC
  Administered 2014-06-11: 600 mg via INTRAVENOUS
  Filled 2014-06-11: qty 50

## 2014-06-11 MED ORDER — MIDAZOLAM HCL 2 MG/2ML IJ SOLN
1.0000 mg | INTRAMUSCULAR | Status: DC | PRN
  Administered 2014-06-11: 2 mg via INTRAVENOUS
  Filled 2014-06-11: qty 2

## 2014-06-11 SURGICAL SUPPLY — 46 items
APL SKNCLS STERI-STRIP NONHPOA (GAUZE/BANDAGES/DRESSINGS) ×1
BAG HAMPER (MISCELLANEOUS) ×2 IMPLANT
BANDAGE ELASTIC 4 VELCRO NS (GAUZE/BANDAGES/DRESSINGS) ×2 IMPLANT
BANDAGE ESMARK 4X12 BL STRL LF (DISPOSABLE) ×1 IMPLANT
BANDAGE GAUZE ELAST BULKY 4 IN (GAUZE/BANDAGES/DRESSINGS) ×1 IMPLANT
BENZOIN TINCTURE PRP APPL 2/3 (GAUZE/BANDAGES/DRESSINGS) ×1 IMPLANT
BLADE 15 SAFETY STRL DISP (BLADE) ×4 IMPLANT
BLADE AVERAGE 25X9 (BLADE) IMPLANT
BNDG CMPR 12X4 ELC STRL LF (DISPOSABLE) ×1
BNDG CONFORM 2 STRL LF (GAUZE/BANDAGES/DRESSINGS) ×2 IMPLANT
BNDG ESMARK 4X12 BLUE STRL LF (DISPOSABLE) ×2
BNDG GAUZE ELAST 4 BULKY (GAUZE/BANDAGES/DRESSINGS) ×2 IMPLANT
CLOTH BEACON ORANGE TIMEOUT ST (SAFETY) ×2 IMPLANT
COVER LIGHT HANDLE STERIS (MISCELLANEOUS) ×5 IMPLANT
CUFF TOURNIQUET SINGLE 18IN (TOURNIQUET CUFF) ×2 IMPLANT
DECANTER SPIKE VIAL GLASS SM (MISCELLANEOUS) ×2 IMPLANT
DRSG ADAPTIC 3X8 NADH LF (GAUZE/BANDAGES/DRESSINGS) ×2 IMPLANT
DRSG EMULSION OIL 3X3 NADH (GAUZE/BANDAGES/DRESSINGS) ×1 IMPLANT
ELECT REM PT RETURN 9FT ADLT (ELECTROSURGICAL) ×2
ELECTRODE REM PT RTRN 9FT ADLT (ELECTROSURGICAL) ×1 IMPLANT
GAUZE SPONGE 4X4 12PLY STRL (GAUZE/BANDAGES/DRESSINGS) ×2 IMPLANT
GLOVE BIO SURGEON STRL SZ7.5 (GLOVE) ×2 IMPLANT
GLOVE BIOGEL PI IND STRL 7.0 (GLOVE) IMPLANT
GLOVE BIOGEL PI INDICATOR 7.0 (GLOVE) ×1
GLOVE EXAM NITRILE MD LF STRL (GLOVE) ×1 IMPLANT
GLOVE SS BIOGEL STRL SZ 6.5 (GLOVE) IMPLANT
GLOVE SUPERSENSE BIOGEL SZ 6.5 (GLOVE) ×1
GOWN STRL REUS W/TWL LRG LVL3 (GOWN DISPOSABLE) ×4 IMPLANT
KIT ROOM TURNOVER APOR (KITS) ×2 IMPLANT
MANIFOLD NEPTUNE II (INSTRUMENTS) ×2 IMPLANT
NDL HYPO 27GX1-1/4 (NEEDLE) ×2 IMPLANT
NEEDLE HYPO 27GX1-1/4 (NEEDLE) ×4 IMPLANT
NS IRRIG 1000ML POUR BTL (IV SOLUTION) ×2 IMPLANT
PACK BASIC LIMB (CUSTOM PROCEDURE TRAY) ×2 IMPLANT
PAD ARMBOARD 7.5X6 YLW CONV (MISCELLANEOUS) ×2 IMPLANT
RASP SM TEAR CROSS CUT (RASP) IMPLANT
SET BASIN LINEN APH (SET/KITS/TRAYS/PACK) ×2 IMPLANT
SOL PREP PROV IODINE SCRUB 4OZ (MISCELLANEOUS) ×2 IMPLANT
SPONGE GAUZE 4X4 12PLY (GAUZE/BANDAGES/DRESSINGS) ×1 IMPLANT
SPONGE LAP 18X18 X RAY DECT (DISPOSABLE) ×2 IMPLANT
STRIP CLOSURE SKIN 1/2X4 (GAUZE/BANDAGES/DRESSINGS) ×1 IMPLANT
SUT ETHILON 4 0 PS 2 18 (SUTURE) IMPLANT
SUT PROLENE 4 0 PS 2 18 (SUTURE) ×2 IMPLANT
SUT VIC AB 4-0 PS2 27 (SUTURE) IMPLANT
SYRINGE CONTROL L 12CC (SYRINGE) ×4 IMPLANT
SYRINGE CONTROL LL 12CC (SYRINGE) ×2 IMPLANT

## 2014-06-11 NOTE — Discharge Instructions (Signed)
Incision Care °An incision is when a surgeon cuts into your body tissues. After surgery, the incision needs to be cared for properly to prevent infection.  °HOME CARE INSTRUCTIONS  °· Take all medicine as directed by your caregiver. Only take over-the-counter or prescription medicines for pain, discomfort, or fever as directed by your caregiver. °· Do not remove your bandage (dressing) or get your incision wet until your surgeon gives you permission. In the event that your dressing becomes wet, dirty, or starts to smell, change the dressing and call your surgeon for instructions as soon as possible. °· Take showers. Do not take tub baths, swim, or do anything that may soak the wound until it is healed. °· Resume your normal diet and activities as directed or allowed. °· Avoid lifting any weight until you are instructed otherwise. °· Use anti-itch antihistamine medicine as directed by your caregiver. The wound may itch when it is healing. Do not pick or scratch at the wound. °· Follow up with your caregiver for stitch (suture) or staple removal as directed. °· Drink enough fluids to keep your urine clear or pale yellow. °SEEK MEDICAL CARE IF:  °· You have redness, swelling, or increasing pain in the wound that is not controlled with medicine. °· You have drainage, blood, or pus coming from the wound that lasts longer than 1 day. °· You develop muscle aches, chills, or a general ill feeling. °· You notice a bad smell coming from the wound or dressing. °· Your wound edges separate after the sutures, staples, or skin adhesive strips have been removed. °· You develop persistent nausea or vomiting. °SEEK IMMEDIATE MEDICAL CARE IF:  °· You have a fever. °· You develop a rash. °· You develop dizzy episodes or faint while standing. °· You have difficulty breathing. °· You develop any reaction or side effects to medicine given. °MAKE SURE YOU:  °· Understand these instructions. °· Will watch your condition. °· Will get help  right away if you are not doing well or get worse. °Document Released: 02/04/2005 Document Revised: 10/10/2011 Document Reviewed: 09/11/2013 °[employer]® Patient Information ©2015 [employer]. This information is not intended to replace advice given to you by your health care provider. Make sure you discuss any questions you have with your health care provider. ° ° °

## 2014-06-11 NOTE — Anesthesia Postprocedure Evaluation (Signed)
  Anesthesia Post-op Note  Patient: Natalie Gray  Procedure(s) Performed: Procedure(s): AMPUTATION RIGHT GREAT TOE (Right)  Patient Location: PACU  Anesthesia Type:MAC  Level of Consciousness: awake, alert  and oriented  Airway and Oxygen Therapy: Patient Spontanous Breathing  Post-op Pain: none  Post-op Assessment: Post-op Vital signs reviewed, Patient's Cardiovascular Status Stable, Respiratory Function Stable, Patent Airway, No signs of Nausea or vomiting and Adequate PO intake  Post-op Vital Signs: Reviewed and stable  Last Vitals:  Filed Vitals:   06/11/14 1230  BP: 98/56  Pulse: 67  Temp:   Resp: 17    Complications: No apparent anesthesia complications

## 2014-06-11 NOTE — H&P (Signed)
HISTORY AND PHYSICAL INTERVAL NOTE:  06/11/2014  10:12 AM  Natalie Gray  has presented today for surgery, with the diagnosis of ulceration of right hallux, diabetes mellitus, peripheral neuropathy.  The various methods of treatment have been discussed with the patient.  No guarantees were given.  After consideration of risks, benefits and other options for treatment, the patient has consented to surgery.  I have reviewed the patients' chart and labs.    Patient Vitals for the past 24 hrs:  BP Temp Temp src Pulse Resp SpO2  06/11/14 1005 (!) 98/57 mmHg - - - 16 96 %  06/11/14 1000 (!) 92/56 mmHg - - - 17 99 %  06/11/14 0955 104/63 mmHg - - - 17 100 %  06/11/14 0950 111/66 mmHg - - - (!) 23 100 %  06/11/14 0945 109/64 mmHg - - - (!) 23 100 %  06/11/14 0940 97/60 mmHg - - - (!) 22 100 %  06/11/14 0935 118/71 mmHg - - - 19 100 %  06/11/14 0930 116/72 mmHg - - - 12 100 %  06/11/14 0925 100/60 mmHg - - - 18 100 %  06/11/14 0920 94/60 mmHg - - - 17 100 %  06/11/14 0915 99/65 mmHg - - - 17 100 %  06/11/14 0910 (!) 105/52 mmHg - - - (!) 22 100 %  06/11/14 0856 (!) 105/52 mmHg 98.1 F (36.7 C) Oral 62 18 100 %    A history and physical examination was performed in my office.  The patient was reexamined.  There have been no changes to this history and physical examination.  Dallas SchimkeBenjamin Ivan Jamorion Gomillion, DPM

## 2014-06-11 NOTE — Anesthesia Preprocedure Evaluation (Signed)
Anesthesia Evaluation  Patient identified by MRN, date of birth, ID band Patient awake    Reviewed: Allergy & Precautions, H&P , NPO status , Patient's Chart, lab work & pertinent test results, reviewed documented beta blocker date and time   Airway Mallampati: II  TM Distance: >3 FB     Dental  (+) Teeth Intact   Pulmonary neg pulmonary ROS,  Hx pulm emboli, resolved. Not anticoagulated now. breath sounds clear to auscultation        Cardiovascular hypertension, Pt. on medications and Pt. on home beta blockers +CHF (cardiomyopathy of unknown etiology, EF recovered from 10% to 45% rently.) and DVT Rhythm:Regular Rate:Normal     Neuro/Psych PSYCHIATRIC DISORDERS Anxiety Depression    GI/Hepatic GERD-  Controlled and Medicated,  Endo/Other  diabetes, Well Controlled, Type 2, Oral Hypoglycemic Agents  Renal/GU      Musculoskeletal   Abdominal   Peds  Hematology   Anesthesia Other Findings   Reproductive/Obstetrics                             Anesthesia Physical Anesthesia Plan  ASA: III  Anesthesia Plan: MAC   Post-op Pain Management:    Induction: Intravenous  Airway Management Planned: Nasal Cannula  Additional Equipment:   Intra-op Plan:   Post-operative Plan:   Informed Consent: I have reviewed the patients History and Physical, chart, labs and discussed the procedure including the risks, benefits and alternatives for the proposed anesthesia with the patient or authorized representative who has indicated his/her understanding and acceptance.     Plan Discussed with:   Anesthesia Plan Comments:         Anesthesia Quick Evaluation

## 2014-06-11 NOTE — Transfer of Care (Signed)
Immediate Anesthesia Transfer of Care Note  Patient: Natalie Gray  Procedure(s) Performed: Procedure(s): AMPUTATION RIGHT GREAT TOE (Right)  Patient Location: PACU  Anesthesia Type:MAC  Level of Consciousness: awake and alert   Airway & Oxygen Therapy: Patient Spontanous Breathing  Post-op Assessment: Report given to PACU RN  Post vital signs: Reviewed and stable  Complications: No apparent anesthesia complications

## 2014-06-11 NOTE — Op Note (Signed)
OPERATIVE NOTE  DATE OF PROCEDURE:  06/11/2014  SURGEON:   Dallas SchimkeBenjamin Ivan Karston Hyland, DPM  OR STAFF:   Circulator: Cyndie Chimeanya Jarrell Smith, RN Scrub Person: Hurshel PartyAngela Maria Witt, CST   PREOPERATIVE DIAGNOSIS:   1.  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  Diabetes mellitus with peripheral neuropathy (ICD-10 E11.42)  POSTOPERATIVE DIAGNOSIS: Same  PROCEDURE: Amputation of the right great toe at the level of the metatarsophalangeal joint of the right foot (CPT [id number])  ANESTHESIA:  Monitor Anesthesia Care   HEMOSTASIS:   Pneumatic ankle tourniquet set at 250 mmHg  ESTIMATED BLOOD LOSS:   Minimal (<5 cc)  MATERIALS USED:  None  INJECTABLES: Marcaine 0.5% plain; 20mL  PATHOLOGY:   None  COMPLICATIONS:   None  INDICATIONS:  Chronic nonhealing ulceration of the right great toe.  DESCRIPTION OF THE PROCEDURE:   The patient was brought to the operating room and placed on the operative table in the supine position.  A pneumatic ankle tourniquet was applied to the patient's ankle.  Following sedation, the surgical site was anesthetized with 0.5% Marcaine plain.  The foot was then prepped, scrubbed, and draped in the usual sterile technique.  The foot was elevated, exsanguinated and the pneumatic ankle tourniquet inflated to 250 mmHg.    Attention was directed to the right great toe where 2 converging semi-elliptical incisions were made surrounding the first metatarsophalangeal joint circumferentially.  Dissection was continued deep down to the level of the metatarsophalangeal joint.  The joint was disarticulated.  The toe was removed and passed from the operative field.  Hypertrophic changes were noted involving the tibial and fibular sesamoids.  Both tibial and fibular sesamoids were removed.  The flexor hallucis longus tendon was distracted distally and transected.  The wound was irrigated with sterile irrigant.  The skin was approximated  using 4-0 prolene in a simple suture technique.  The incision sites were reinforced with Steri-Strips.  A sterile compressive dressing was applied to the operative foot.  The patient's ankle tourniquet was deflated.  A prompt hyperemic response was noted to all digits of the operative foot.    The patient tolerated the procedure well.  The patient was then transferred to PACU with vital signs stable and vascular status intact to all toes of the operative foot.  Following a period of postoperative monitoring, the patient will be discharged home.

## 2014-06-12 ENCOUNTER — Encounter (HOSPITAL_COMMUNITY): Payer: Self-pay | Admitting: Podiatry

## 2015-08-31 ENCOUNTER — Other Ambulatory Visit: Payer: Self-pay | Admitting: Podiatry

## 2015-09-02 NOTE — Patient Instructions (Signed)
Natalie Gray  09/02/2015     @   Your procedure is scheduled on  09/09/2015   Report to [facility] at  820  A.M.  Call this number if you have problems the morning of surgery:  [phone]   Remember:  Do not eat food or drink liquids after midnight.  Take these medicines the morning of surgery with A SIP OF WATER  Xanax, coreg, celebrex, cymbalta, nexium, hydrocodone, lisinopril, topamax, claritin.   Do not wear jewelry, make-up or nail polish.  Do not wear lotions, powders, or perfumes.  You may wear deodorant.  Do not shave 48 hours prior to surgery.  Men may shave face and neck.  Do not bring valuables to the hospital.  [facility] is not responsible for any belongings or valuables.  Contacts, dentures or bridgework may not be worn into surgery.  Leave your suitcase in the car.  After surgery it may be brought to your room.  For patients admitted to the hospital, discharge time will be determined by your treatment team.  Patients discharged the day of surgery will not be allowed to drive home.   Name and phone number of your driver:   family Special instructions:  none  Please read over the following fact sheets that you were given. Coughing and Deep Breathing, Surgical Site Infection Prevention, Anesthesia Post-op Instructions and Care and Recovery After Surgery      Achilles Tendon Repair Your Achilles tendon is the strong, rope-like cord of tissue that connects your lower leg muscles to your heel. If the Achilles tendon tears (ruptures), you may have surgery to reconnect the ends of the tendon. This surgery is called Achilles tendon repair.  Achilles tendon repair may be done at a hospital or at an outpatient surgery center. It takes from 30 minutes to 1 hour to complete. Most people who have the surgery are able to go home the same day as the surgery. The surgery is usually successful, but the recovery period can be long. LET  [facility] CARE PROVIDER KNOW ABOUT:  Any allergies you have.  All medicines you are taking, including vitamins, herbs, eye drops, creams, and over-the-counter medicines.  Previous problems you or members of your family have had with the use of anesthetics.  Any blood disorders you have.  Previous surgeries you have had.  Medical conditions you have, including any skin conditions you develop before surgery. RISKS AND COMPLICATIONS Generally, this is a safe procedure. However, as with any procedure, problems can occur. Possible problems include:  Reactions to anesthetics.  Infection.  Bleeding.  Delayed healing.  Scarring.  Nerve damage causing numbness.  Re-rupture of your tendon (rare). BEFORE THE PROCEDURE  Discuss the risks and benefits of the surgery with your health care provider.  Ask your health care provider if you can take your regular medicines with a small sip of water on the morning of your surgery.  Be careful not to injure the skin on your leg.  Shower or bathe the night before the surgery or the morning of the surgery.  Do not eat or drink anything for 6-8 hours before your surgery or as directed by your health care provider.  If you smoke, quit before your surgery. Smoking makes it harder for your body to heal. PROCEDURE  A numbing medicine (local anesthetic) will be injected into the area around your ankle and lower leg.  Your  lower leg will be cleaned and draped for surgery.  You will be given medicine that makes you sleep (general anesthetic).  The surgeon will make a cut (incision) over your Achilles tendon.  The torn ends of your tendon will be stitched back together.  The incisions will be closed with stitches or staples.  A bandage will be applied. AFTER THE PROCEDURE  After the medicine wears off, you will be taken to the recovery area and monitored closely.  You will feel some pain when the numbing medicine wears off. Your health  care provider will prescribe pain medicine for you to take at home.  Your leg may be put in a cast or splint.  Use crutches or another type of walking aid to keep weight off your leg.  You will be allowed to go home once you are awake, stable, able to drink, and have no other problems.   This information is not intended to replace advice given to you by your health care provider. Make sure you discuss any questions you have with your health care provider.   Document Released: 07/23/2013 Document Reviewed: 07/23/2013 Elsevier Interactive Patient Education 2016 Elsevier Inc. Achilles Tendon Repair, Care After Refer to this sheet in the next few weeks. These instructions provide you with information on caring for yourself after your procedure. Your health care provider may also give you more specific instructions. Your treatment has been planned according to current medical practices, but problems sometimes occur. Call your health care provider if you have any problems or questions after your procedure. WHAT TO EXPECT AFTER THE PROCEDURE  Your lower leg may feel numb for several hours.  You may feel pain when the numbing medicine wears off.  Most people can start to walk normally in about 6 weeks. It may be 6 months before you can do all your regular activities. Complete recovery can take a year or longer. HOME CARE INSTRUCTIONS  Take pain medicines as directed by your health care provider.  Do not take over-the-counter medicines unless your health care provider says it is okay.  Do not walk on or put weight on your injured leg.  Keep your cast or splint dry while bathing.  Use crutches or another walking aid.  When you are resting, keep your leg above the level of your heart.  Go back to your health care provider about 2 weeks after surgery to have your splint or cast removed. If you have stitches, your health care provider will also take them out during this time.  After your  cast or splint is removed, your health care provider will give you instructions on how to move your ankle and how much weight you can put on your leg. Follow your health care provider's instructions.  See a physical therapist if directed by your health care provider. A physical therapist can help you regain ankle motion and strengthen your leg muscles.  Keep all follow-up appointments. SEEK MEDICAL CARE IF:   You have chills.  You have a fever.  Your pain medicine is not working.  You have persistent numbness or burning. SEEK IMMEDIATE MEDICAL CARE IF:   You have pain or numbness that is getting worse.  You are unable to move your toes.  You are bleeding through your cast or splint.  You notice redness, swelling, bleeding, or discharge near your cut (incision) after your cast or splint has been removed.  You notice warmth, swelling, or tenderness in the back of your lower leg.  You have chest pain.  You have trouble breathing. MAKE SURE YOU:  Understand these instructions.  Will watch your condition.  Will get help right away if you are not doing well or get worse.   This information is not intended to replace advice given to you by your health care provider. Make sure you discuss any questions you have with your health care provider.   Document Released: 03/23/2004 Document Revised: 07/23/2013 Document Reviewed: 06/03/2013 Elsevier Interactive Patient Education 2016 Elsevier Inc. Traumatic Toe Amputation A traumatic toe amputation is when you lose part or all of a toe because of an accident or injury. The severity of this type of injury can vary widely. It can mean that just the tip of your toe gets ripped off (avulsion), or it can mean that you completely lose a toe (amputation). Traumatic toe amputation is a medical emergency. It requires immediate care to prevent further damage and to save the toe. CAUSES A traumatic toe amputation usually results from an accident  that involves:  A car.  Power tools.  Factory work.  Farm or Risk manager. SYMPTOMS Symptoms of a traumatic toe amputation include:  Bleeding.  Pain.  Damage to surrounding tissues, such as bones, muscles, tendons, and skin. Severe injuries can sometimes lead to shock, which is a life-threatening condition in which your body fails to get blood, oxygen, and nutrients to vital organs. Some common symptoms of shock include low blood pressure, sweating, weakness, and pale skin. DIAGNOSIS Your health care provider will do a physical exam and ask for details about how your injury occurred. The physical exam will help your health care provider to determine the severity of the injury and the best way to repair it. X-rays may be done to check for damage to the surrounding bones and tissues. TREATMENT Treatment depends on the type of injury that you have and how bad it is.  Your health care provider will clean the wound thoroughly and apply a medicine (anesthetic) to relieve pain.  If just the tip of your toe was removed, the wound will typically heal on its own with a protective bandage (dressing) and regular cleaning.  For more severe injuries, a portion of skin may need to be taken from another part of the body (graft) and attached to the wound site until the wound heals.  If a large portion of the toe was amputated, it may be possible to reattachit surgically (replantation). HOME CARE INSTRUCTIONS  Take medicines only as directed by your health care provider.  If you were prescribed an antibiotic medicine, finish all of it even if you start to feel better.  There are many different ways to close and cover a wound, including stitches (sutures), skin glue, and adhesive strips. Follow your health care provider's instructions about:  Wound care.  Dressing changes and removal.  Wound closure removal.  Check your wound every day for signs of infection. Watch for:  Redness,  swelling, or pain.  Fluid, blood, or pus.  Do exercises to strengthen your toe and foot as directed by your health care provider. SEEK MEDICAL CARE IF:  Your wound does not seem to be healing well.  You have redness, swelling, or pain at your wound site.  You have fluid, blood, or pus coming from your wound.  You have a fever.   This information is not intended to replace advice given to you by your health care provider. Make sure you discuss any questions you have with your health  care provider.   Document Released: 06/08/2005 Document Revised: 08/08/2014 Document Reviewed: 04/02/2014 Elsevier Interactive Patient Education 2016 Elsevier Inc. Living With an Amputation The most common causes of amputation from the hip down include:  Diseases that:  Reduce the blood flow to an area of your body.  Decrease your body's ability to fight infection.  Traumatic injuries that cause significant damage to body tissues.  Birth defects.  Cancerous lumps (malignant tumors). Amputation above the hip is usually the result of trauma or birth defect. Disease is a less common cause. Living with an amputation can be challenging, but you can still live a long, productive life. WHAT ARE THE COMMON CHALLENGES OF LIVING WITH AN AMPUTATION? The most common challenges are mobility and self-care. With some new habits, though, it is often possible to do all of the activities you used to do. You may be able to use a device that substitutes for your limb (prosthesis). The prosthesis helps you adapt more quickly to these challenges. Your health care provider can help select a prosthesis to meet your needs. A person who helps you choose and fits you with a prosthesis (prosthetist) may also help. A rehabilitation program can also help you gain mobility and self-reliance. Your rehabilitation team may include:  Physicians.  Physical and occupational therapists.  Prosthetists.  Nurses.  Social  workers.  Psychologists.  Dietitians. Your rehabilitation team will help you with all aspects of recovery and returning to work, home, sports, and your community. You will learn to:  Get around safely.  Adjust your home.  Exercise.  Use a prosthetic.  Work through Surveyor, quantity.  Connect with other people who have gone through the same experience. Additional challenges may include:  Grieving period.  Body image issues.  Lifestyle issues, such as sex.  Maintaining a healthy weight. These issues are normal. Discuss these with your rehabilitation team. WHEN CAN I RETURN TO MY REGULAR ACTIVITIES?  Returning to your normal activities is part of healing. Changes can often be made to equipment that allow you to return to a sport or hobby. Some Arboriculturist for this. Discuss all of your leisure interests with your health care provider and prosthetist. WHEN CAN I RETURN TO WORK? When you are ready to return to work, your therapists can perform job site evaluations and make recommendations to help you perform your job. You may not be able to return to your same job. Your local Office of Vocational Rehabilitation can assist you in job retraining. FOR MORE INFORMATION: Visit these online resources. You can find tips on everything from getting dressed and using bathrooms to driving and travel considerations. These resources can also connect you to a network of emotional support, activities, and innovations. You may also search the Internet to find a local support group.  Amputee Coalition: http://www.amputee-coalition.org/ensuring-fall-safety/http://www.amputee-coalition.org/ensuring-fall-safety/  Amputee Support Groups: http://amputee.TempNets.tn.supportgroups.com  Daily Strength: GenuineNerd.fi  [employer]:  http://www.nationalamputation.org/http://www.nationalamputation.[facility] on Health, Physical Activity and Disability: http://blankenship-martinez.net/.org  Disabled Sports [place]: http://www.disabledsportsusa.orghttp://www.disabledsportsusa.org  American Academy of Orthotists and Prosthetists: https://garcia.net/.org   This information is not intended to replace advice given to you by your health care provider. Make sure you discuss any questions you have with your health care provider.   Document Released: 04/09/2002 Document Revised: 08/08/2014 Document Reviewed: 12/03/2013 Elsevier Interactive Patient Education 2016 Elsevier Inc. PATIENT INSTRUCTIONS POST-ANESTHESIA  IMMEDIATELY FOLLOWING SURGERY:  Do not drive or operate machinery for the first twenty four hours after surgery.  Do not make any important  decisions for twenty four hours after surgery or while taking narcotic pain medications or sedatives.  If you develop intractable nausea and vomiting or a severe headache please notify your doctor immediately.  FOLLOW-UP:  Please make an appointment with your surgeon as instructed. You do not need to follow up with anesthesia unless specifically instructed to do so.  WOUND CARE INSTRUCTIONS (if applicable):  Keep a dry clean dressing on the anesthesia/puncture wound site if there is drainage.  Once the wound has quit draining you may leave it open to air.  Generally you should leave the bandage intact for twenty four hours unless there is drainage.  If the epidural site drains for more than 36-48 hours please call the anesthesia department.  QUESTIONS?:  Please feel free to call your physician or the hospital operator if you have any questions, and they will be happy to assist you.

## 2015-09-03 ENCOUNTER — Encounter (HOSPITAL_COMMUNITY)
Admission: RE | Admit: 2015-09-03 | Discharge: 2015-09-03 | Disposition: A | Discharge status: Home / Self Care | Payer: BLUE CROSS/BLUE SHIELD | Source: Ambulatory Visit | Attending: Podiatry | Admitting: Podiatry

## 2015-09-03 ENCOUNTER — Ambulatory Visit (HOSPITAL_COMMUNITY)
Admission: RE | Admit: 2015-09-03 | Discharge: 2015-09-03 | Disposition: A | Discharge status: Home / Self Care | Payer: BLUE CROSS/BLUE SHIELD | Source: Ambulatory Visit | Attending: Podiatry | Admitting: Podiatry

## 2015-09-03 ENCOUNTER — Other Ambulatory Visit: Payer: Self-pay

## 2015-09-03 ENCOUNTER — Encounter (HOSPITAL_COMMUNITY): Payer: Self-pay

## 2015-09-03 DIAGNOSIS — M009 Pyogenic arthritis, unspecified: Secondary | ICD-10-CM | POA: Diagnosis not present

## 2015-09-03 DIAGNOSIS — M869 Osteomyelitis, unspecified: Secondary | ICD-10-CM | POA: Diagnosis not present

## 2015-09-03 DIAGNOSIS — L97512 Non-pressure chronic ulcer of other part of right foot with fat layer exposed: Secondary | ICD-10-CM | POA: Insufficient documentation

## 2015-09-03 DIAGNOSIS — Z89421 Acquired absence of other right toe(s): Secondary | ICD-10-CM | POA: Diagnosis not present

## 2015-09-03 DIAGNOSIS — M1460 Charcot's joint, unspecified site: Secondary | ICD-10-CM | POA: Insufficient documentation

## 2015-09-03 HISTORY — DX: Cardiomyopathy, unspecified: I42.9

## 2015-09-03 LAB — CBC WITH DIFFERENTIAL/PLATELET
Basophils Absolute: 0.1 10*3/uL (ref 0.0–0.1)
Basophils Relative: 1 %
Eosinophils Absolute: 0.2 10*3/uL (ref 0.0–0.7)
Eosinophils Relative: 3 %
HCT: 39.5 % (ref 36.0–46.0)
HEMOGLOBIN: 12.7 g/dL (ref 12.0–15.0)
LYMPHS ABS: 2.9 10*3/uL (ref 0.7–4.0)
Lymphocytes Relative: 37 %
MCH: 29.6 pg (ref 26.0–34.0)
MCHC: 32.2 g/dL (ref 30.0–36.0)
MCV: 92.1 fL (ref 78.0–100.0)
MONOS PCT: 9 %
Monocytes Absolute: 0.7 10*3/uL (ref 0.1–1.0)
NEUTROS PCT: 50 %
Neutro Abs: 4 10*3/uL (ref 1.7–7.7)
Platelets: 229 10*3/uL (ref 150–400)
RBC: 4.29 MIL/uL (ref 3.87–5.11)
RDW: 13.3 % (ref 11.5–15.5)
WBC: 7.8 10*3/uL (ref 4.0–10.5)

## 2015-09-04 LAB — BASIC METABOLIC PANEL
ANION GAP: 12 (ref 5–15)
BUN: 22 mg/dL — ABNORMAL HIGH (ref 6–20)
CHLORIDE: 106 mmol/L (ref 101–111)
CO2: 23 mmol/L (ref 22–32)
Calcium: 9.4 mg/dL (ref 8.9–10.3)
Creatinine, Ser: 1.11 mg/dL — ABNORMAL HIGH (ref 0.44–1.00)
GFR calc Af Amer: >60 mL/min (ref >60)
GFR calc non Af Amer: 54 mL/min — ABNORMAL LOW (ref >60)
GLUCOSE: 104 mg/dL — AB (ref 65–99)
POTASSIUM: 4.7 mmol/L (ref 3.5–5.1)
Sodium: 141 mmol/L (ref 135–145)

## 2015-09-09 ENCOUNTER — Ambulatory Visit (HOSPITAL_COMMUNITY): Payer: BLUE CROSS/BLUE SHIELD

## 2015-09-09 ENCOUNTER — Encounter (HOSPITAL_COMMUNITY)
Admission: RE | Disposition: A | Discharge status: Home / Self Care | Payer: Self-pay | Source: Ambulatory Visit | Attending: Podiatry

## 2015-09-09 ENCOUNTER — Ambulatory Visit (HOSPITAL_COMMUNITY)
Admission: RE | Admit: 2015-09-09 | Discharge: 2015-09-09 | Disposition: A | Discharge status: Home / Self Care | Payer: BLUE CROSS/BLUE SHIELD | Source: Ambulatory Visit | Attending: Podiatry | Admitting: Podiatry

## 2015-09-09 ENCOUNTER — Encounter (HOSPITAL_COMMUNITY): Payer: Self-pay | Admitting: *Deleted

## 2015-09-09 ENCOUNTER — Ambulatory Visit (HOSPITAL_COMMUNITY): Payer: BLUE CROSS/BLUE SHIELD | Admitting: Anesthesiology

## 2015-09-09 DIAGNOSIS — Z9889 Other specified postprocedural states: Secondary | ICD-10-CM

## 2015-09-09 DIAGNOSIS — Z86711 Personal history of pulmonary embolism: Secondary | ICD-10-CM | POA: Diagnosis not present

## 2015-09-09 DIAGNOSIS — F418 Other specified anxiety disorders: Secondary | ICD-10-CM | POA: Insufficient documentation

## 2015-09-09 DIAGNOSIS — M2041 Other hammer toe(s) (acquired), right foot: Secondary | ICD-10-CM | POA: Diagnosis not present

## 2015-09-09 DIAGNOSIS — Z86718 Personal history of other venous thrombosis and embolism: Secondary | ICD-10-CM | POA: Insufficient documentation

## 2015-09-09 DIAGNOSIS — I110 Hypertensive heart disease with heart failure: Secondary | ICD-10-CM | POA: Insufficient documentation

## 2015-09-09 DIAGNOSIS — M899 Disorder of bone, unspecified: Secondary | ICD-10-CM | POA: Insufficient documentation

## 2015-09-09 DIAGNOSIS — Z96698 Presence of other orthopedic joint implants: Secondary | ICD-10-CM | POA: Insufficient documentation

## 2015-09-09 DIAGNOSIS — Z79899 Other long term (current) drug therapy: Secondary | ICD-10-CM | POA: Diagnosis not present

## 2015-09-09 DIAGNOSIS — L97519 Non-pressure chronic ulcer of other part of right foot with unspecified severity: Secondary | ICD-10-CM | POA: Insufficient documentation

## 2015-09-09 DIAGNOSIS — M868X7 Other osteomyelitis, ankle and foot: Secondary | ICD-10-CM | POA: Insufficient documentation

## 2015-09-09 DIAGNOSIS — I509 Heart failure, unspecified: Secondary | ICD-10-CM | POA: Insufficient documentation

## 2015-09-09 DIAGNOSIS — E1142 Type 2 diabetes mellitus with diabetic polyneuropathy: Secondary | ICD-10-CM | POA: Insufficient documentation

## 2015-09-09 DIAGNOSIS — Z7984 Long term (current) use of oral hypoglycemic drugs: Secondary | ICD-10-CM | POA: Insufficient documentation

## 2015-09-09 HISTORY — PX: ACHILLES TENDON SURGERY: SHX542

## 2015-09-09 HISTORY — PX: TRANSMETATARSAL AMPUTATION: SHX6197

## 2015-09-09 LAB — GLUCOSE, CAPILLARY
GLUCOSE-CAPILLARY: 77 mg/dL (ref 65–99)
Glucose-Capillary: 152 mg/dL — ABNORMAL HIGH (ref 65–99)

## 2015-09-09 SURGERY — AMPUTATION, FOOT, TRANSMETATARSAL
Anesthesia: Monitor Anesthesia Care | Site: Foot | Laterality: Right

## 2015-09-09 MED ORDER — FENTANYL CITRATE (PF) 100 MCG/2ML IJ SOLN: 25.0000 ug | INTRAMUSCULAR | Status: DC | PRN

## 2015-09-09 MED ORDER — CLINDAMYCIN PHOSPHATE 600 MG/50ML IV SOLN
600.0000 mg | Freq: Once | INTRAVENOUS | Status: AC
  Administered 2015-09-09: 600 mg via INTRAVENOUS
  Filled 2015-09-09: qty 50

## 2015-09-09 MED ORDER — LACTATED RINGERS IV SOLN
INTRAVENOUS | Status: DC
  Administered 2015-09-09 (×2): via INTRAVENOUS

## 2015-09-09 MED ORDER — FENTANYL CITRATE (PF) 250 MCG/5ML IJ SOLN
INTRAMUSCULAR | Status: DC | PRN
  Administered 2015-09-09: 25 ug via INTRAVENOUS

## 2015-09-09 MED ORDER — BUPIVACAINE HCL (PF) 0.5 % IJ SOLN
INTRAMUSCULAR | Status: AC
  Filled 2015-09-09: qty 30

## 2015-09-09 MED ORDER — CLINDAMYCIN HCL 300 MG PO CAPS: 300.0000 mg | ORAL_CAPSULE | Freq: Three times a day (TID) | ORAL | Status: AC

## 2015-09-09 MED ORDER — MIDAZOLAM HCL 5 MG/5ML IJ SOLN
INTRAMUSCULAR | Status: DC | PRN
  Administered 2015-09-09 (×2): 1 mg via INTRAVENOUS

## 2015-09-09 MED ORDER — PROPOFOL 10 MG/ML IV BOLUS
INTRAVENOUS | Status: DC | PRN
  Administered 2015-09-09: 15 mg via INTRAVENOUS

## 2015-09-09 MED ORDER — ONDANSETRON HCL 4 MG/2ML IJ SOLN: 4.0000 mg | Freq: Once | INTRAMUSCULAR | Status: DC | PRN

## 2015-09-09 MED ORDER — MIDAZOLAM HCL 2 MG/2ML IJ SOLN
INTRAMUSCULAR | Status: AC
  Filled 2015-09-09: qty 2

## 2015-09-09 MED ORDER — PROPOFOL 500 MG/50ML IV EMUL
INTRAVENOUS | Status: DC | PRN
  Administered 2015-09-09: 11:00:00 via INTRAVENOUS
  Administered 2015-09-09: 25 ug/kg/min via INTRAVENOUS

## 2015-09-09 MED ORDER — BUPIVACAINE HCL (PF) 0.5 % IJ SOLN
INTRAMUSCULAR | Status: DC | PRN
  Administered 2015-09-09: 7 mL
  Administered 2015-09-09: 20 mL

## 2015-09-09 MED ORDER — FENTANYL CITRATE (PF) 100 MCG/2ML IJ SOLN
25.0000 ug | INTRAMUSCULAR | Status: AC
  Administered 2015-09-09 (×2): 25 ug via INTRAVENOUS
  Filled 2015-09-09: qty 2

## 2015-09-09 MED ORDER — FENTANYL CITRATE (PF) 100 MCG/2ML IJ SOLN
INTRAMUSCULAR | Status: AC
  Filled 2015-09-09: qty 2

## 2015-09-09 MED ORDER — PROMETHAZINE HCL 12.5 MG PO TABS: 12.5000 mg | ORAL_TABLET | Freq: Four times a day (QID) | ORAL | Status: AC | PRN

## 2015-09-09 MED ORDER — PROPOFOL 10 MG/ML IV BOLUS
INTRAVENOUS | Status: AC
  Filled 2015-09-09: qty 20

## 2015-09-09 MED ORDER — MIDAZOLAM HCL 2 MG/2ML IJ SOLN
1.0000 mg | INTRAMUSCULAR | Status: DC | PRN
  Administered 2015-09-09 (×2): 1 mg via INTRAVENOUS
  Filled 2015-09-09: qty 2

## 2015-09-09 MED ORDER — LIDOCAINE HCL (PF) 1 % IJ SOLN
INTRAMUSCULAR | Status: AC
  Filled 2015-09-09: qty 30

## 2015-09-09 MED ORDER — MEPERIDINE HCL 50 MG PO TABS: 50.0000 mg | ORAL_TABLET | ORAL | Status: AC | PRN

## 2015-09-09 MED ORDER — 0.9 % SODIUM CHLORIDE (POUR BTL) OPTIME
TOPICAL | Status: DC | PRN
  Administered 2015-09-09: 1000 mL

## 2015-09-09 SURGICAL SUPPLY — 53 items
APL SKNCLS STERI-STRIP NONHPOA (GAUZE/BANDAGES/DRESSINGS) ×1
BAG HAMPER (MISCELLANEOUS) ×2 IMPLANT
BANDAGE ELASTIC 4 VELCRO NS (GAUZE/BANDAGES/DRESSINGS) ×3 IMPLANT
BANDAGE ESMARK 4X12 BL STRL LF (DISPOSABLE) ×1 IMPLANT
BENZOIN TINCTURE PRP APPL 2/3 (GAUZE/BANDAGES/DRESSINGS) ×2 IMPLANT
BLADE 15 SAFETY STRL DISP (BLADE) ×4 IMPLANT
BLADE AVERAGE 25X9 (BLADE) ×2 IMPLANT
BNDG CMPR 12X4 ELC STRL LF (DISPOSABLE) ×1
BNDG CONFORM 2 STRL LF (GAUZE/BANDAGES/DRESSINGS) ×2 IMPLANT
BNDG ESMARK 4X12 BLUE STRL LF (DISPOSABLE) ×2
BNDG GAUZE ELAST 4 BULKY (GAUZE/BANDAGES/DRESSINGS) ×2 IMPLANT
CLOTH BEACON ORANGE TIMEOUT ST (SAFETY) ×2 IMPLANT
COVER LIGHT HANDLE STERIS (MISCELLANEOUS) ×4 IMPLANT
CUFF TOURNIQUET SINGLE 18IN (TOURNIQUET CUFF) ×2 IMPLANT
DECANTER SPIKE VIAL GLASS SM (MISCELLANEOUS) ×2 IMPLANT
DRAPE OEC MINIVIEW 54X84 (DRAPES) ×2 IMPLANT
DRSG ADAPTIC 3X8 NADH LF (GAUZE/BANDAGES/DRESSINGS) ×2 IMPLANT
DURA STEPPER SML (CAST SUPPLIES) ×1 IMPLANT
ELECT REM PT RETURN 9FT ADLT (ELECTROSURGICAL) ×2
ELECTRODE REM PT RTRN 9FT ADLT (ELECTROSURGICAL) ×1 IMPLANT
FORMALIN 10 PREFIL 480ML (MISCELLANEOUS) ×1 IMPLANT
GAUZE KERLIX 2  STERILE LF (GAUZE/BANDAGES/DRESSINGS) ×1 IMPLANT
GAUZE SPONGE 4X4 12PLY STRL (GAUZE/BANDAGES/DRESSINGS) ×2 IMPLANT
GLOVE BIO SURGEON STRL SZ7 (GLOVE) ×4 IMPLANT
GLOVE BIO SURGEON STRL SZ7.5 (GLOVE) ×2 IMPLANT
GLOVE BIOGEL M STRL SZ7.5 (GLOVE) ×2 IMPLANT
GLOVE BIOGEL PI IND STRL 7.0 (GLOVE) ×1 IMPLANT
GLOVE BIOGEL PI IND STRL 7.5 (GLOVE) IMPLANT
GLOVE BIOGEL PI INDICATOR 7.0 (GLOVE) ×3
GLOVE BIOGEL PI INDICATOR 7.5 (GLOVE) ×1
GLOVE ECLIPSE 6.5 STRL STRAW (GLOVE) ×1 IMPLANT
GLOVE EXAM NITRILE PF MED BLUE (GLOVE) ×1 IMPLANT
GOWN STRL REUS W/TWL LRG LVL3 (GOWN DISPOSABLE) ×4 IMPLANT
K-WIRE NON THREAD 1.1 (WIRE) ×2
KIT ROOM TURNOVER APOR (KITS) ×2 IMPLANT
KWIRE NON THREAD 1.1 (WIRE) ×1 IMPLANT
MANIFOLD NEPTUNE II (INSTRUMENTS) ×2 IMPLANT
NDL HYPO 27GX1-1/4 (NEEDLE) ×2 IMPLANT
NEEDLE HYPO 27GX1-1/4 (NEEDLE) ×4 IMPLANT
NS IRRIG 1000ML POUR BTL (IV SOLUTION) ×2 IMPLANT
PACK BASIC LIMB (CUSTOM PROCEDURE TRAY) ×2 IMPLANT
PAD ABD 5X9 TENDERSORB (GAUZE/BANDAGES/DRESSINGS) ×1 IMPLANT
PAD ARMBOARD 7.5X6 YLW CONV (MISCELLANEOUS) ×2 IMPLANT
RASP SM TEAR CROSS CUT (RASP) ×2 IMPLANT
SET BASIN LINEN APH (SET/KITS/TRAYS/PACK) ×2 IMPLANT
SOL PREP PROV IODINE SCRUB 4OZ (MISCELLANEOUS) ×2 IMPLANT
SPONGE LAP 18X18 X RAY DECT (DISPOSABLE) ×2 IMPLANT
STRIP CLOSURE SKIN 1/2X4 (GAUZE/BANDAGES/DRESSINGS) ×2 IMPLANT
SUT ETHILON 3 0 FSL (SUTURE) ×2 IMPLANT
SUT ETHILON 4 0 PS 2 18 (SUTURE) ×1 IMPLANT
SYR CONTROL 10ML LL (SYRINGE) ×4 IMPLANT
SYSTEM CHEST DRAIN TLS 7FR (DRAIN) ×1 IMPLANT
WATER STERILE IRR 1000ML POUR (IV SOLUTION) ×2 IMPLANT

## 2015-09-09 NOTE — Op Note (Signed)
OPERATIVE NOTE  DATE OF PROCEDURE 09/09/2015  SURGEON Dallas Schimke, [place]  OR STAFF Circulator: Horton Marshall, RN Relief Circulator: Lennox Pippins, RN Relief Scrub: Lennox Pippins, RN Scrub Person: Illene Silver, CST RN First Assistant: Lizabeth Leyden, RN   PREOPERATIVE DIAGNOSIS 1.  Osteomyelitis of third toe, right foot 2.  Ulceration of lateral aspect of third toe, right foot 3.  Hammertoe deformity of digits 3-5, right foot   4.  Exostosis, right foot 5.  Retained hardware of first metatarsal, right foot 6.  Diabetes mellitus with peripheral neuropathy  POSTOPERATIVE DIAGNOSIS Same  PROCEDURE 1.  Transmetatarsal amputation, right foot 2.  Ostectomy, right foot 3.  Removal of k-wire from first metatarsal, right foot   4.  Achilles tendon lengthening, right foot  ANESTHESIA Monitor Anesthesia Care   HEMOSTASIS Pneumatic ankle tourniquet set at 250 mmHg  ESTIMATED BLOOD LOSS Less than 25 mL  MATERIALS USED TLS drain  INJECTABLES 0.5% Marcaine plain  PATHOLOGY 1.  Digits 3 through 5 with metatarsals, rigth foot  COMPLICATIONS None  INDICATIONS:  Chronic ulceration of the right foot in patient who has already had her first and second digits amputated.  DESCRIPTION OF THE PROCEDURE:  The patient was brought to the operating room and placed on the operative table in the supine position.  Following sedation, the surgical site was anesthetized with 0.5% Marcaine plain.  The foot was then prepped, scrubbed, and draped in the usual sterile technique.  A pneumatic ankle tourniquet was applied to the operative extremity.  The foot was elevated, exsanguinated and the pneumatic ankle tourniquet inflated to 250 mmHg.    A fishmouth incision was made on the skin just proximal to the base of the digits.  Sharp dissection was carried down through skin and subcutaneous tissue to the level of the metatarsals. Subperiosteal elevation was carried along the metatarsals  dorsally. A k-wire in the first metatarsal was identified and removed.  The metatarsals were cut with a power saw bevelling the cut plantarward. The plantar soft tissues were dissected and cut through.  The forefoot was passed off the field. The wound was then irrigated copiously. A dorsal medial prominence of the midfoot at the naviculocuneiform joint was noted.  The dorsal flap was reflected dorsally.  The dorsal prominence was smoothed with a power rasp.  The wound was again irrigated copiously.  Hemostasis was achieved with electrocautery. The wound was closed with horizontal mattress sutures of 3-0 nylon. A sterile dressing was applied to the right foot.  The pneumatic tourniquet was released.  A percutaneous triple hemi-section tendo-Achilles lengthening was carried out. This allowed the ankle to dorsiflex approximately 30 degrees with the knee extended.  The incisions were closed with 4-0 nylon.  A sterile dressing was applied to the right foot.   The patient tolerated the procedure well.  The patient was then transferred to PACU with vital signs stable and vascular status intact to all toes of the operative foot.

## 2015-09-09 NOTE — Discharge Instructions (Signed)

## 2015-09-09 NOTE — Anesthesia Postprocedure Evaluation (Signed)
Anesthesia Post Note  Patient: Natalie Gray  Procedure(s) Performed: Procedure(s) (LRB): TRANSMETATARSAL AMPUTATION RIGHT FOOT (AMPUTATION OF REMAINING TOES WITH PORTION OF METATARSALS, LENGTHENING OF ACHILLES TENDON OF RIGHT LOWER EXTREMITY, FILE BONE DOWN ON TOP OF RIGHT FOOT) (Right) PERCUTANEOUS TENDOACHILLES LENGTHENING/KIDNER (Right)  Patient location during evaluation: PACU Anesthesia Type: MAC Level of consciousness: awake and alert, oriented and patient cooperative Pain management: pain level controlled Vital Signs Assessment: post-procedure vital signs reviewed and stable Respiratory status: spontaneous breathing and patient connected to face mask oxygen Cardiovascular status: blood pressure returned to baseline and stable Postop Assessment: no signs of nausea or vomiting Anesthetic complications: no    Last Vitals:  Filed Vitals:   09/09/15 0955 09/09/15 1000  BP: 98/60 96/59  Pulse:    Temp:    Resp:      Last Pain: There were no vitals filed for this visit.               Yassine Brunsman J

## 2015-09-09 NOTE — H&P (Signed)
HISTORY AND PHYSICAL INTERVAL NOTE:  09/09/2015  9:48 AM  Natalie Gray  has presented today for surgery, with the diagnosis of and ulceration lateral aspect 3rd toe right foot, hammer toe deformity digits 3 - 5 right foot, diabetes mellitus, peripheral neuropathy.  The various methods of treatment have been discussed with the patient.  No guarantees were given.  After consideration of risks, benefits and other options for treatment, the patient has consented to surgery.  I have reviewed the patients' chart and labs.    Patient Vitals for the past 24 hrs:  BP Temp Temp src Pulse Resp SpO2 Height Weight  09/09/15 0940 101/62 mmHg - - - - 98 % - -  09/09/15 0935 95/61 mmHg - - - - 98 % - -  09/09/15 0930 101/65 mmHg - - - - 99 % - -  09/09/15 0925 (!) 94/57 mmHg - - - - 98 % - -  09/09/15 0920 98/63 mmHg - - - - 99 % - -  09/09/15 0915 (!) 93/58 mmHg - - - - 99 % - -  09/09/15 0906 (!) 95/58 mmHg 97.3 F (36.3 C) Oral 70 (!) 25 99 %  (1.626 m) 195 lb (88.451 kg)    A history and physical examination was performed in my office.  The patient was reexamined.  There have been no changes to this history and physical examination.  Dallas Schimke, DPM

## 2015-09-09 NOTE — Anesthesia Preprocedure Evaluation (Signed)
Anesthesia Evaluation  Patient identified by MRN, date of birth, ID band Patient awake    Reviewed: Allergy & Precautions, H&P , NPO status , Patient's Chart, lab work & pertinent test results, reviewed documented beta blocker date and time   Airway Mallampati: II  TM Distance: >3 FB     Dental  (+) Teeth Intact   Pulmonary neg pulmonary ROS,  Hx pulm emboli, resolved. Not anticoagulated now. breath sounds clear to auscultation        Cardiovascular hypertension, Pt. on medications and Pt. on home beta blockers +CHF (cardiomyopathy of unknown etiology, EF recovered from 10% to 45% rently.) and DVT Rhythm:Regular Rate:Normal     Neuro/Psych PSYCHIATRIC DISORDERS Anxiety Depression    GI/Hepatic GERD-  Controlled and Medicated,  Endo/Other  diabetes, Well Controlled, Type 2, Oral Hypoglycemic Agents  Renal/GU      Musculoskeletal   Abdominal   Peds  Hematology   Anesthesia Other Findings   Reproductive/Obstetrics                             Anesthesia Physical Anesthesia Plan  ASA: III  Anesthesia Plan: MAC   Post-op Pain Management:    Induction: Intravenous  Airway Management Planned: Nasal Cannula  Additional Equipment:   Intra-op Plan:   Post-operative Plan:   Informed Consent: I have reviewed the patients History and Physical, chart, labs and discussed the procedure including the risks, benefits and alternatives for the proposed anesthesia with the patient or authorized representative who has indicated his/her understanding and acceptance.     Plan Discussed with:   Anesthesia Plan Comments:         Anesthesia Quick Evaluation  

## 2015-09-09 NOTE — Brief Op Note (Addendum)
BRIEF OPERATIVE NOTE  DATE OF PROCEDURE 09/09/2015  SURGEON Dallas Schimke, [place]  OR STAFF Circulator: Horton Marshall, RN Relief Circulator: Lennox Pippins, RN Relief Scrub: Lennox Pippins, RN Scrub Person: Illene Silver, CST RN First Assistant: Lizabeth Leyden, RN   PREOPERATIVE DIAGNOSIS 1. Osteomyelitis of third toe, right foot 2. Ulceration of lateral aspect of third toe, right foot 3. Hammertoe deformity of digits 3-5, right foot  4. Exostosis, right foot 5. Retained hardware of first metatarsal, right foot 6. Diabetes mellitus with peripheral neuropathy  POSTOPERATIVE DIAGNOSIS Same  PROCEDURE 1. Transmetatarsal amputation, right foot 2. Ostectomy, right foot 3. Removal of k-wire from first metatarsal, right foot  4. Achilles tendon lengthening, right foot  ANESTHESIA Monitor Anesthesia Care   HEMOSTASIS Pneumatic ankle tourniquet set at 250 mmHg  ESTIMATED BLOOD LOSS Less than 25 mL  MATERIALS USED TLS drain  INJECTABLES 0.5% Marcaine plain  PATHOLOGY 1.  Digits 3 through 5 with metatarsals, rigth foot  COMPLICATIONS None

## 2015-09-09 NOTE — Transfer of Care (Signed)
Immediate Anesthesia Transfer of Care Note  Patient: Natalie Gray  Procedure(s) Performed: Procedure(s): TRANSMETATARSAL AMPUTATION RIGHT FOOT (AMPUTATION OF REMAINING TOES WITH PORTION OF METATARSALS, LENGTHENING OF ACHILLES TENDON OF RIGHT LOWER EXTREMITY, FILE BONE DOWN ON TOP OF RIGHT FOOT) (Right) PERCUTANEOUS TENDOACHILLES LENGTHENING/KIDNER (Right)  Patient Location: PACU  Anesthesia Type:MAC  Level of Consciousness: awake, alert , oriented and patient cooperative  Airway & Oxygen Therapy: Patient Spontanous Breathing and Patient connected to face mask oxygen  Post-op Assessment: Report given to RN, Post -op Vital signs reviewed and stable and Patient moving all extremities  Post vital signs: Reviewed and stable  Last Vitals:  Filed Vitals:   09/09/15 0955 09/09/15 1000  BP: 98/60 96/59  Pulse:    Temp:    Resp:      Complications: No apparent anesthesia complications

## 2015-09-10 ENCOUNTER — Encounter (HOSPITAL_COMMUNITY): Payer: Self-pay | Admitting: Podiatry

## 2016-06-08 IMAGING — DX DG FOOT COMPLETE 3+V*R*
3 series · 3 of 3 positions shown · non-contrast
Comparison: 06/11/2014

CLINICAL DATA: Preoperative evaluation, diabetic foot, RIGHT foot
ulcer with fat layers exposed, prior amputations, hypertension,
cardiomyopathy

EXAM:
RIGHT FOOT COMPLETE - 3+ VIEW

[foot ap]
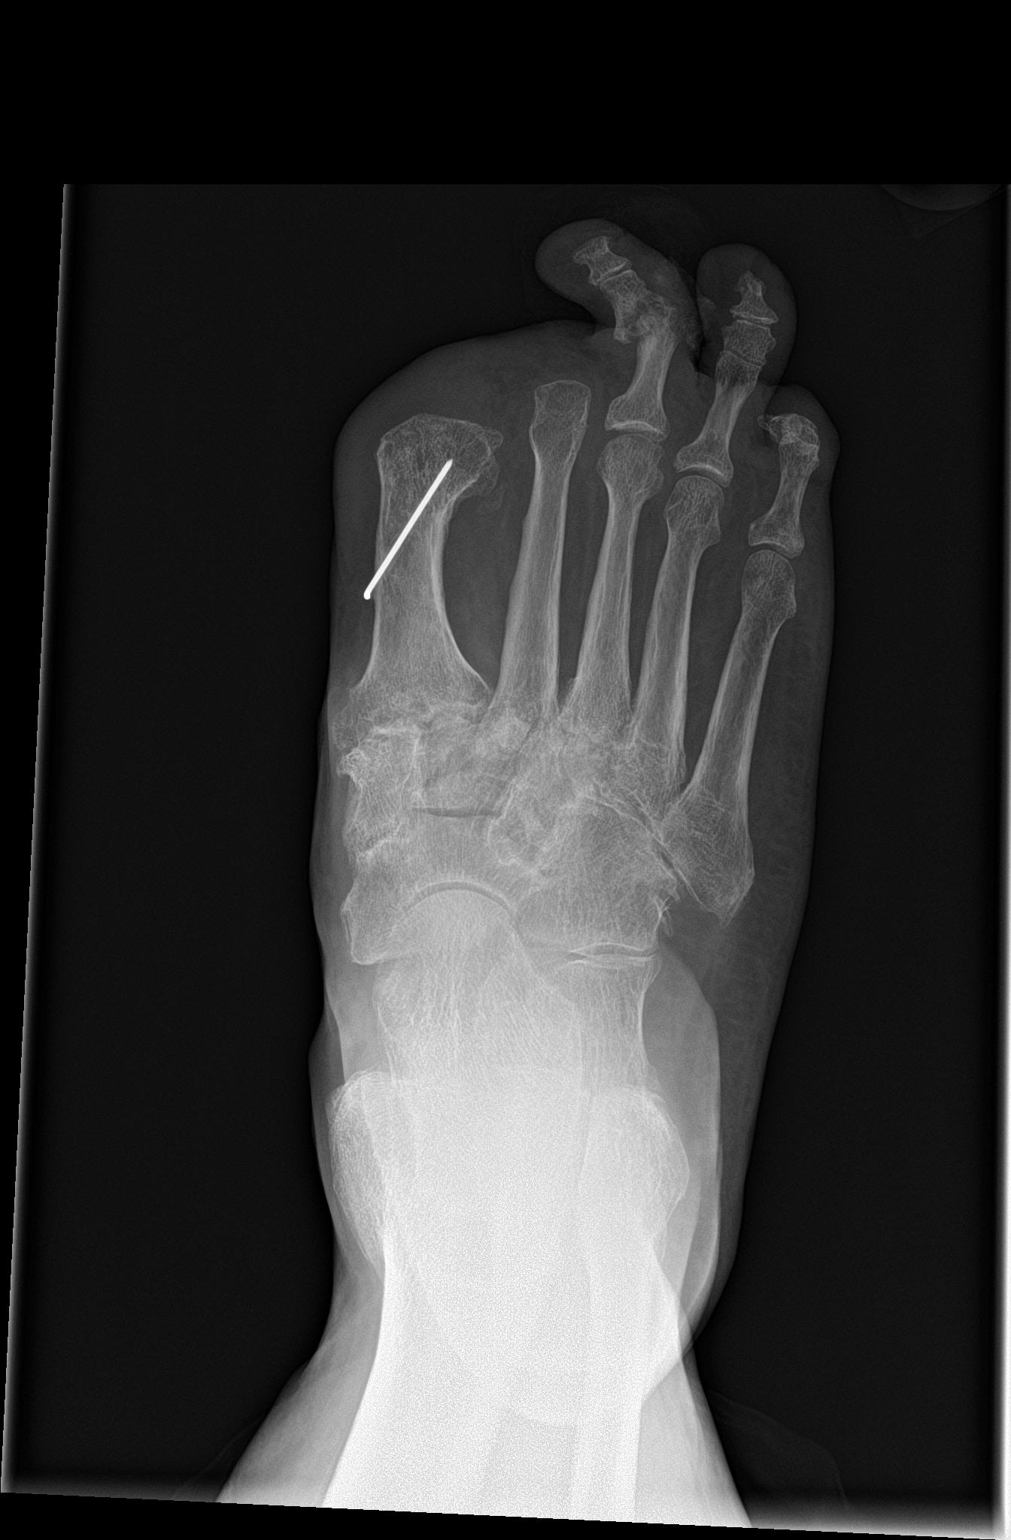

[foot obl]
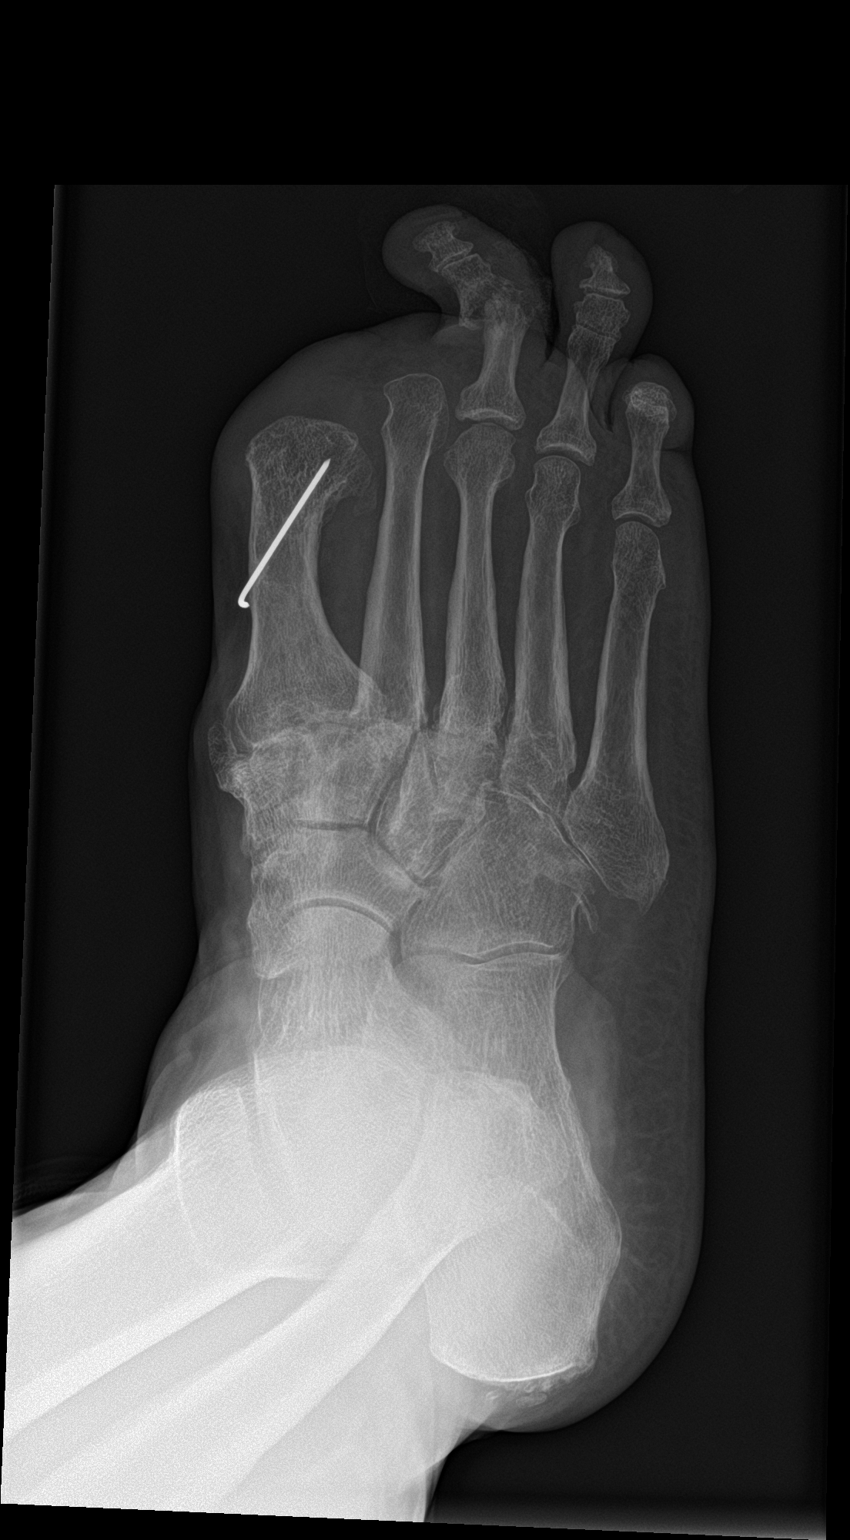

[foot lat]
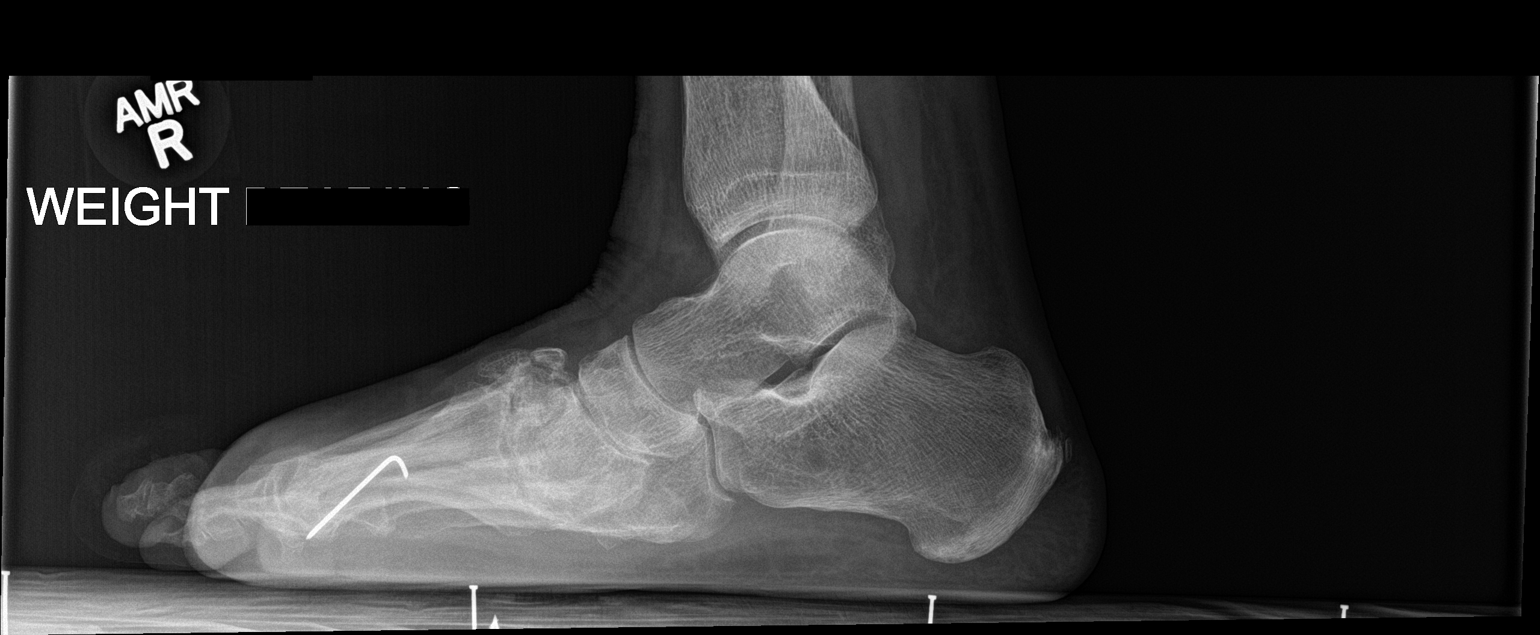

[3 of 3 positions shown; findings below may reference images not displayed]

FINDINGS: K-wire and distal first metatarsal.

Prior amputation of first and second toes.

Dressing artifacts at third toe.

Marked bone destruction at PIP joint third toe compatible with
osteomyelitis and septic arthritis of the PIP joint.

Associated soft tissue swelling with deformity of third toe, with
apex lateral angulation at PIP joint.

Joint space narrowing spur formation at the TMT joints diffusely and
scattered intertarsal joints

Diffuse osseous demineralization.

No additional fracture, dislocation, or bone destruction.
IMPRESSION: Prior amputations of first and second toes.

Septic arthritis and osteomyelitis involving the PIP joint of the
third toe with overlying dressing artifacts.

Osseous demineralization with extensive Charcot arthropathy
involving the TMT joints particularly the 1-2/3st.

## 2016-06-14 IMAGING — CR DG FOOT COMPLETE 3+V*R*
1 series · 3 of 3 positions shown · non-contrast
Comparison: Plain film of the right foot dated 09/03/2015.

CLINICAL DATA: Postop diabetic

EXAM:
RIGHT FOOT COMPLETE - 3+ VIEW

[Series 2: ap · 0.17mm/px · 3 of 3 slices shown]
[im 1/3]
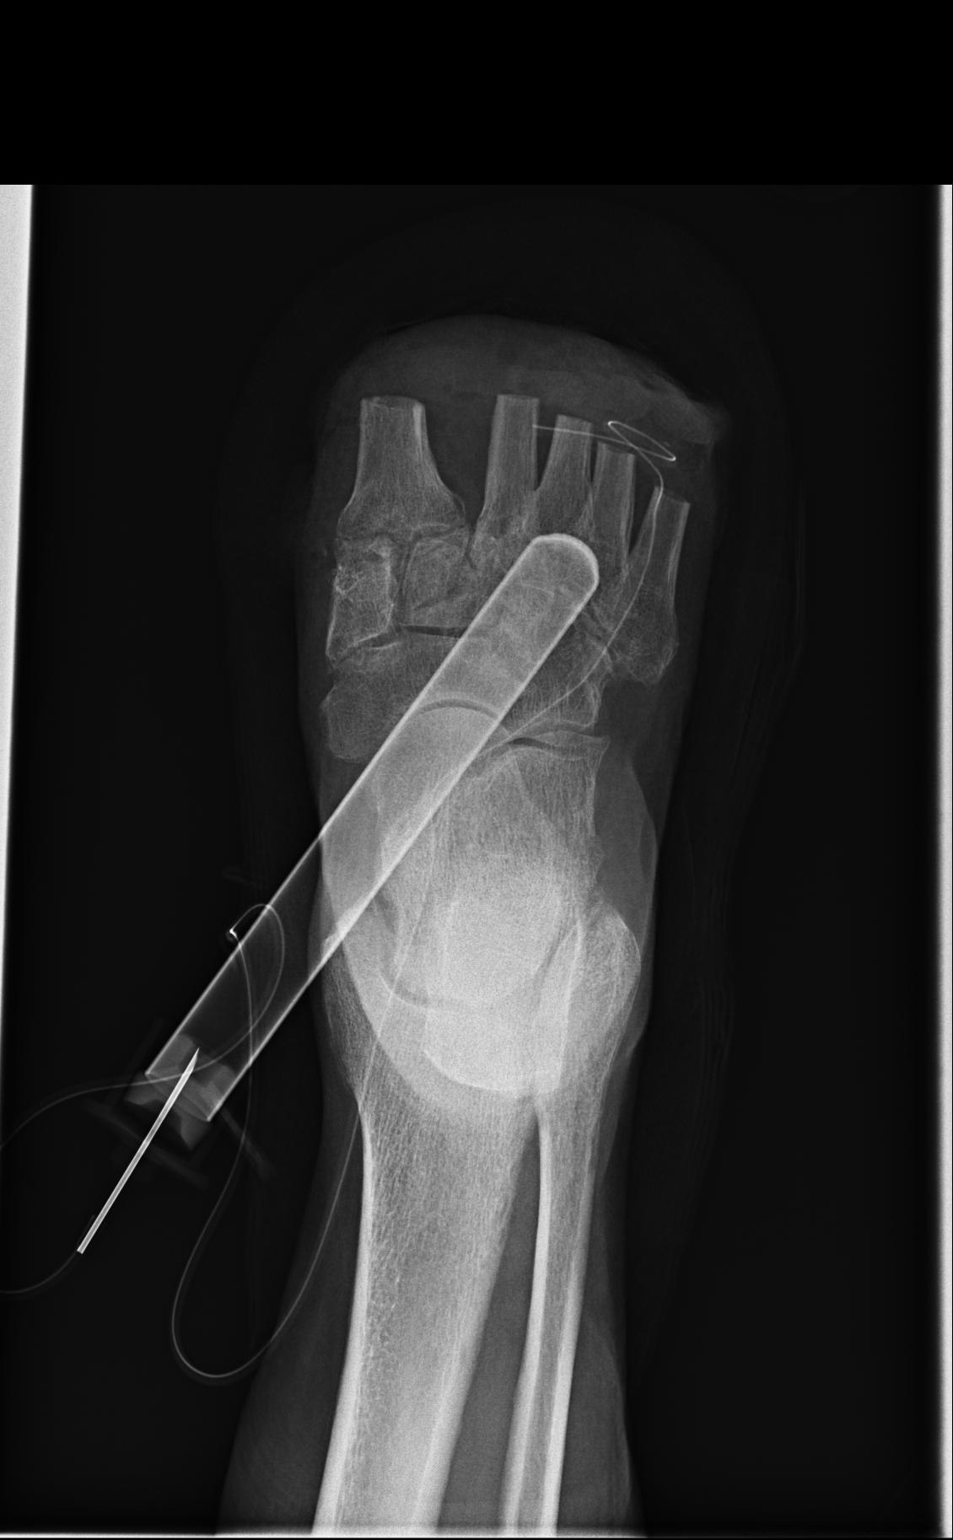
[im 2/3]
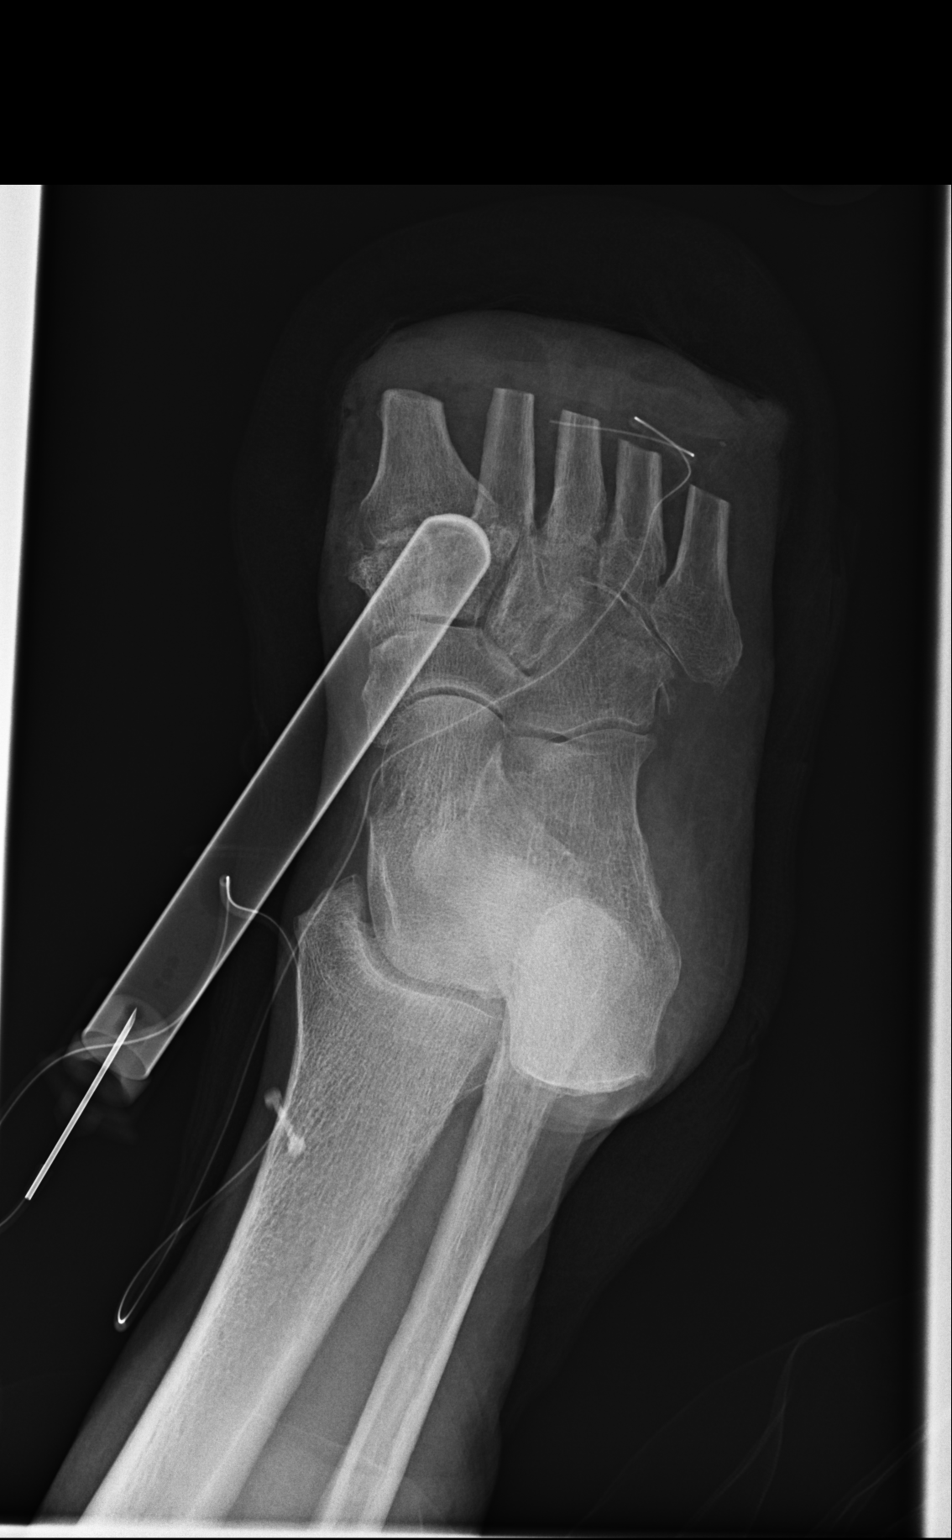
[im 3/3]
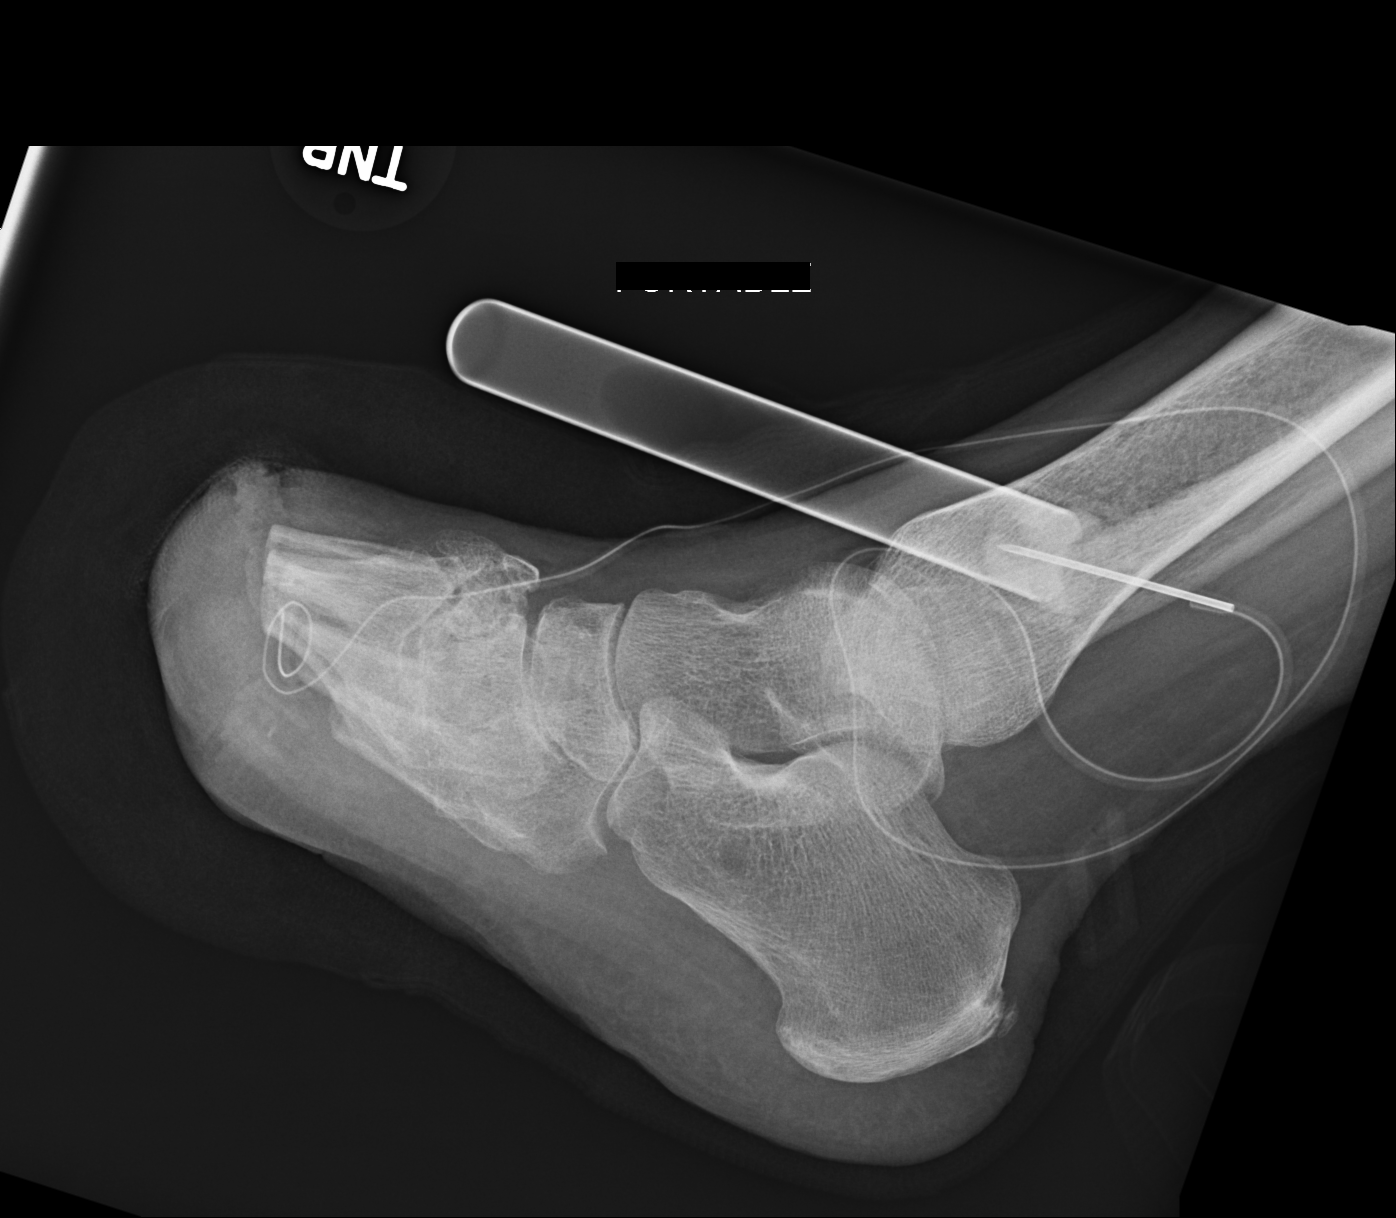

[3 of 3 positions shown; findings below may reference images not displayed]

FINDINGS: Interval amputation surgery involving all metatarsal bones, mid
shaft region. More proximal osseous structures are unremarkable.
Small caliber drainage catheter overlies the amputation site. No
evidence of surgical complicating feature seen.
IMPRESSION: Status post amputation surgery. No evidence of surgical complicating
feature.

## 2017-03-24 ENCOUNTER — Telehealth (INDEPENDENT_AMBULATORY_CARE_PROVIDER_SITE_OTHER): Payer: Self-pay

## 2017-03-24 NOTE — Telephone Encounter (Signed)
Pt called to make new pt appt with Dr. Alvester Morin for low back pain. Husband is a pt of his. Has been treating with chiro and will bring notes with her. Scheduled her for 04/06/17 @ 9:30 which was the 1st available and put on waiting list.

## 2017-04-06 ENCOUNTER — Ambulatory Visit (INDEPENDENT_AMBULATORY_CARE_PROVIDER_SITE_OTHER): Payer: BLUE CROSS/BLUE SHIELD | Admitting: Physical Medicine and Rehabilitation

## 2017-04-06 ENCOUNTER — Encounter (INDEPENDENT_AMBULATORY_CARE_PROVIDER_SITE_OTHER): Payer: Self-pay | Admitting: Physical Medicine and Rehabilitation

## 2017-04-06 VITALS — BP 134/90 | HR 100

## 2017-04-06 DIAGNOSIS — G8929 Other chronic pain: Secondary | ICD-10-CM | POA: Diagnosis not present

## 2017-04-06 DIAGNOSIS — M5416 Radiculopathy, lumbar region: Secondary | ICD-10-CM

## 2017-04-06 DIAGNOSIS — M5442 Lumbago with sciatica, left side: Secondary | ICD-10-CM | POA: Diagnosis not present

## 2017-04-06 NOTE — Progress Notes (Deleted)
Lower back pain. Brought x-ray done at Chiropractor 2 years ago. Pain radiates down back of left leg. Cramping in back of left thigh. Had increase in pain after a recent trip to [place]. Has gotten slightly better recently, but still hurts. Feels pain more lying down in bed. Moody BruinsFrank Tate, DC

## 2017-04-17 ENCOUNTER — Ambulatory Visit (INDEPENDENT_AMBULATORY_CARE_PROVIDER_SITE_OTHER): Payer: BLUE CROSS/BLUE SHIELD | Admitting: Physical Medicine and Rehabilitation

## 2017-04-17 ENCOUNTER — Ambulatory Visit (INDEPENDENT_AMBULATORY_CARE_PROVIDER_SITE_OTHER): Payer: Self-pay

## 2017-04-17 ENCOUNTER — Encounter (INDEPENDENT_AMBULATORY_CARE_PROVIDER_SITE_OTHER): Payer: Self-pay | Admitting: Physical Medicine and Rehabilitation

## 2017-04-17 VITALS — BP 102/68 | HR 100

## 2017-04-17 DIAGNOSIS — G8929 Other chronic pain: Secondary | ICD-10-CM

## 2017-04-17 DIAGNOSIS — M5442 Lumbago with sciatica, left side: Secondary | ICD-10-CM | POA: Diagnosis not present

## 2017-04-17 DIAGNOSIS — M5416 Radiculopathy, lumbar region: Secondary | ICD-10-CM | POA: Diagnosis not present

## 2017-04-17 MED ORDER — LIDOCAINE HCL (PF) 1 % IJ SOLN
2.0000 mL | Freq: Once | INTRAMUSCULAR | Status: AC
  Administered 2017-04-17: 2 mL

## 2017-04-17 MED ORDER — BETAMETHASONE SOD PHOS & ACET 6 (3-3) MG/ML IJ SUSP
12.0000 mg | Freq: Once | INTRAMUSCULAR | Status: AC
  Administered 2017-04-17: 12 mg

## 2017-04-17 NOTE — Progress Notes (Deleted)
Patient is here for planned left S1 transforaminal injection. No change in symptoms.  

## 2017-04-17 NOTE — Progress Notes (Unsigned)
}";=[  polkiujh

## 2017-04-17 NOTE — Patient Instructions (Signed)

## 2017-04-20 ENCOUNTER — Encounter (INDEPENDENT_AMBULATORY_CARE_PROVIDER_SITE_OTHER): Payer: Self-pay | Admitting: Physical Medicine and Rehabilitation

## 2017-04-20 NOTE — Procedures (Signed)
Natalie Gray is a 61 year old female here for planned left S1 transforaminal epidural steroid injection.The injection  will be diagnostic and hopefully therapeutic. The patient has failed conservative care including time, medications and activity modification. Please see our prior evaluation and management note for further details and justification.  S1 Lumbosacral Transforaminal Epidural Steroid Injection - Sub-Pedicular Approach with Fluoroscopic Guidance   Patient: Natalie Gray      Date of Birth: Jul 12, 1956 MRN: [id number] PCP: Darci Needle, MD      Visit Date: 04/17/2017   Universal Protocol:    Date/Time: 09/20/185:41 AM  Consent Given By: the patient  Position:  PRONE  Additional Comments: Vital signs were monitored before and after the procedure. Patient was prepped and draped in the usual sterile fashion. The correct patient, procedure, and site was verified.   Injection Procedure Details:  Procedure Site One Meds Administered:  Meds ordered this encounter  Medications  . lidocaine (PF) (XYLOCAINE) 1 % injection 2 mL  . betamethasone acetate-betamethasone sodium phosphate (CELESTONE) injection 12 mg    Laterality: Left  Location/Site:  S1 Foramen   Needle size: 22 G  Needle type: Spinal  Needle Placement: Transforaminal  Findings:  -Contrast Used: 1 mL iohexol 180 mg iodine/mL   -Comments: Excellent flow of contrast along the nerve and into the epidural space.  Procedure Details: After squaring off the sacral end-plate to get a true AP view, the C-arm was positioned so that the best possible view of the S1 foramen was visualized. The soft tissues overlying this structure were infiltrated with 2-3 ml. of 1% Lidocaine without Epinephrine.    The spinal needle was inserted toward the target using a "trajectory" view along the fluoroscope beam.  Under AP and lateral visualization, the needle was advanced so it did not puncture dura. Biplanar projections were  used to confirm position. Aspiration was confirmed to be negative for CSF and/or blood. A 1-2 ml. volume of Isovue-250 was injected and flow of contrast was noted at each level. Radiographs were obtained for documentation purposes.   After attaining the desired flow of contrast documented above, a 0.5 to 1.0 ml test dose of 0.25% Marcaine was injected into each respective transforaminal space.  The patient was observed for 90 seconds post injection.  After no sensory deficits were reported, and normal lower extremity motor function was noted,   the above injectate was administered so that equal amounts of the injectate were placed at each foramen (level) into the transforaminal epidural space.   Additional Comments:  The patient tolerated the procedure well Dressing: Band-Aid    Post-procedure details: Patient was observed during the procedure. Post-procedure instructions were reviewed.  Patient left the clinic in stable condition.

## 2017-04-20 NOTE — Progress Notes (Signed)
Natalie Gray - 61 y.o. female MRN [id number]  Date of birth: 1956-07-12  Office Visit Note: Visit Date: 04/06/2017 PCP: Darci Needle, MD Referred by: Darci Needle, MD  Subjective: Chief Complaint  Patient presents with  . Lower Back - Pain   HPI: Natalie Gray is a very her chronic left-sided low back pain with pain referring down the back of the leg in the left thigh. Natalie Gray reports increasing symptoms recently after her trip to [place] with a lot of walking and sitting on the airplane. The flight to see him for quite a long. Natalie Gray denies any right-sided complaints. Natalie Gray reports having some slight improvement with the pain recently but still significant pain which is limiting what Natalie Gray can do. Natalie Gray has been taking some mild pain indications as well as working with her chiropractor Dr. Moody Bruins, DC. Natalie Gray did bring x-rays on CD from 2 years ago. I did briefly review those and it does not show any listhesis or fracture. There is mild degenerative changes. There is some facet arthropathy at the lower levels. Natalie Gray has not had MRI of the lumbar spine. Natalie Gray denies any right-sided complaints. Natalie Gray's had no specific trauma or focal weakness. Her case is complicated by significant diabetes with complications from the diabetes. Natalie Gray did have a right transmetatarsal amputation performed in the early part of this year. Natalie Gray feels like sometimes walking differently may have exacerbated her back pain. Natalie Gray's had no prior lumbar injections or lumbar surgery. Natalie Gray's had no fevers chills or night sweats. No unexplained weight loss.    Review of Systems  Constitutional: Negative for chills, fever, malaise/fatigue and weight loss.  HENT: Negative for hearing loss and sinus pain.   Eyes: Negative for blurred vision, double vision and photophobia.  Respiratory: Negative for cough and shortness of breath.   Cardiovascular: Negative for chest pain, palpitations and leg swelling.  Gastrointestinal: Negative for abdominal  pain, nausea and vomiting.  Genitourinary: Negative for flank pain.  Musculoskeletal: Positive for back pain. Negative for myalgias.       Right buttock and thigh pain posteriorly  Skin: Negative for itching and rash.  Neurological: Negative for tremors, focal weakness and weakness.  Endo/Heme/Allergies: Negative.   Psychiatric/Behavioral: Negative for depression.  All other systems reviewed and are negative.  Otherwise per HPI.  Assessment & Plan: Visit Diagnoses:  1. Lumbar radiculopathy   2. Chronic left-sided low back pain with left-sided sciatica     Plan: Findings:  Chronic history of off and on back pain with regular visits to her chiropractor Dr. Moody Bruins. Natalie Gray reports working with him more recently in the symptoms of just not improved. Natalie Gray is getting radicular-type pain into the hamstring and buttock on the right. Natalie Gray does have worsening with sitting but Natalie Gray is able to sit and has no pain or pressure just in the area of the issue. I do think this may be a small disc herniation or disc-related problem from the long flight to [place] with increased sitting. Natalie Gray feels like it's exacerbated from change of gait from prior transmetatarsal amputation. Nonetheless I think with the fact that Natalie Gray has had time in good conservative care a well-placed S1 transforaminal epidural steroid injection may go along way diagnostically and therapeutically for her. Natalie Gray has no other red flag symptoms. If Natalie Gray doesn't get much relief with this as problematic I would look at an MRI of the lumbar spine. Natalie Gray can continue treatments with Dr. Arlana Pouch.    Meds & Orders: No  orders of the defined types were placed in this encounter.  No orders of the defined types were placed in this encounter.   Follow-up: Return for Left S1 transforaminal epidural steroid injection.   Procedures: No procedures performed  No notes on file   Clinical History: No specialty comments available.  Natalie Gray reports that Natalie Gray has never  smoked. Natalie Gray has never used smokeless tobacco. No results for input(s): HGBA1C, LABURIC in the last 8760 hours.  Objective:  VS:  HT:    WT:   BMI:     BP:134/90  HR:100bpm  TEMP: ( )  RESP:  Physical Exam  Constitutional: Natalie Gray is oriented to person, place, and time. Natalie Gray appears well-developed and well-nourished. No distress.  HENT:  Head: Normocephalic and atraumatic.  Nose: Nose normal.  Mouth/Throat: Oropharynx is clear and moist.  Eyes: Pupils are equal, round, and reactive to light. Conjunctivae are normal.  Neck: Normal range of motion. Neck supple.  Cardiovascular: Regular rhythm and intact distal pulses.   Pulmonary/Chest: Effort normal. No respiratory distress.  Abdominal: Natalie Gray exhibits no distension. There is no guarding.  Musculoskeletal:  Patient ambulates without aid with slightly antalgic gait to the right. Natalie Gray has a positive slump test to the right with tight hamstring. Natalie Gray has no clonus bilaterally. Natalie Gray does have transmetatarsal" on the right lower limb. Natalie Gray has no pain with hip rotation. Natalie Gray does have pain with extension of the lumbar spine. No pain over the greater trochanters.  Neurological: Natalie Gray is alert and oriented to person, place, and time. Natalie Gray exhibits normal muscle tone. Coordination normal.  Skin: Skin is warm. No rash noted. No erythema.  Psychiatric: Natalie Gray has a normal mood and affect. Her behavior is normal.  Nursing note and vitals reviewed.   Ortho Exam Imaging: No results found.  Past Medical/Family/Surgical/Social History: Medications & Allergies reviewed per EMR There are no active problems to display for this patient.  Past Medical History:  Diagnosis Date  . Anxiety   . Arthritis   . Cardiomyopathy (HCC) 2012  . Depression   . Diabetes mellitus without complication (HCC)   . GERD (gastroesophageal reflux disease)   . Hypertension   . Neuropathy   . Pulmonary embolus (HCC)    with osteomylitis and ;arge dosages of Antibiotics through picc    History reviewed. No pertinent family history. Past Surgical History:  Procedure Laterality Date  . ACHILLES TENDON SURGERY Right 09/09/2015   Procedure: PERCUTANEOUS TENDOACHILLES LENGTHENING/KIDNER;  Surgeon: Ferman Hamming, DPM;  Location: [facility];  Service: Podiatry;  Laterality: Right;  . AMPUTATION Right 07/18/2013   Procedure: AMPUTATION 2ND TOE RIGHT FOOT ;  Surgeon: Dallas Schimke, DPM;  Location: [facility];  Service: Orthopedics;  Laterality: Right;  . AMPUTATION Right 06/11/2014   Procedure: AMPUTATION RIGHT GREAT TOE;  Surgeon: Dallas Schimke, DPM;  Location: [facility];  Service: Podiatry;  Laterality: Right;  . BUNIONECTOMY Bilateral   . DEBRIDEMENT TENNIS ELBOW Left   . FOOT ARTHRODESIS Right 07/18/2013   Procedure: REPAIR IPJ NON-UNION RIGHT HALLUX;  Surgeon: Dallas Schimke, DPM;  Location: [facility];  Service: Orthopedics;  Laterality: Right;  . FOOT BONE EXCISION Right    [facility]-2nd toe  . REFRACTIVE SURGERY    . TONSILLECTOMY    . TRANSMETATARSAL AMPUTATION Right 09/09/2015   Procedure: TRANSMETATARSAL AMPUTATION RIGHT FOOT (AMPUTATION OF REMAINING TOES WITH PORTION OF METATARSALS, LENGTHENING OF ACHILLES TENDON OF RIGHT LOWER EXTREMITY, FILE BONE DOWN ON TOP OF RIGHT FOOT);  Surgeon: Ferman Hamming, DPM;  Location: [facility];  Service: Podiatry;  Laterality: Right;  . TUBAL LIGATION     Social History   Occupational History  . Not on file.   Social History Main Topics  . Smoking status: Never Smoker  . Smokeless tobacco: Never Used  . Alcohol use No  . Drug use: No  . Sexual activity: Yes    Birth control/ protection: Surgical

## 2018-01-10 ENCOUNTER — Encounter: Payer: Self-pay | Admitting: Physical Medicine and Rehabilitation

## 2018-05-22 ENCOUNTER — Encounter: Payer: Self-pay | Admitting: Physical Medicine and Rehabilitation

## 2018-06-07 ENCOUNTER — Telehealth (INDEPENDENT_AMBULATORY_CARE_PROVIDER_SITE_OTHER): Payer: Self-pay | Admitting: Physical Medicine and Rehabilitation

## 2018-06-07 ENCOUNTER — Encounter (INDEPENDENT_AMBULATORY_CARE_PROVIDER_SITE_OTHER): Payer: Self-pay | Admitting: Physical Medicine and Rehabilitation

## 2018-06-07 ENCOUNTER — Ambulatory Visit (INDEPENDENT_AMBULATORY_CARE_PROVIDER_SITE_OTHER): Payer: BLUE CROSS/BLUE SHIELD | Admitting: Physical Medicine and Rehabilitation

## 2018-06-07 VITALS — BP 122/91 | HR 86 | Ht 64.0 in | Wt 180.0 lb

## 2018-06-07 DIAGNOSIS — G8929 Other chronic pain: Secondary | ICD-10-CM

## 2018-06-07 DIAGNOSIS — M546 Pain in thoracic spine: Secondary | ICD-10-CM | POA: Diagnosis not present

## 2018-06-07 DIAGNOSIS — M545 Low back pain, unspecified: Secondary | ICD-10-CM

## 2018-06-07 DIAGNOSIS — M47816 Spondylosis without myelopathy or radiculopathy, lumbar region: Secondary | ICD-10-CM | POA: Diagnosis not present

## 2018-06-07 NOTE — Progress Notes (Signed)
 .  Numeric Pain Rating Scale and Functional Assessment Average Pain 6 Pain Right Now 4 My pain is intermittent, dull and aching Pain is worse with: bending and some activites Pain improves with: heat/ice   In the last MONTH (on 0-10 scale) has pain interfered with the following?  1. General activity like being  able to carry out your everyday physical activities such as walking, climbing stairs, carrying groceries, or moving a chair?  Rating(5)  2. Relation with others like being able to carry out your usual social activities and roles such as  activities at home, at work and in your community. Rating(5)  3. Enjoyment of life such that you have  been bothered by emotional problems such as feeling anxious, depressed or irritable?  Rating(0)

## 2018-06-08 NOTE — Telephone Encounter (Signed)
Ana from [employer] no pa is needed for procedure code [id number].

## 2018-06-08 NOTE — Telephone Encounter (Signed)
Called insurance at [phone] and lvm trying to get pa.

## 2018-06-19 ENCOUNTER — Ambulatory Visit (INDEPENDENT_AMBULATORY_CARE_PROVIDER_SITE_OTHER): Payer: Self-pay

## 2018-06-19 ENCOUNTER — Encounter (INDEPENDENT_AMBULATORY_CARE_PROVIDER_SITE_OTHER): Payer: Self-pay | Admitting: Physical Medicine and Rehabilitation

## 2018-06-19 ENCOUNTER — Ambulatory Visit (INDEPENDENT_AMBULATORY_CARE_PROVIDER_SITE_OTHER): Payer: BLUE CROSS/BLUE SHIELD | Admitting: Physical Medicine and Rehabilitation

## 2018-06-19 VITALS — BP 106/64 | HR 76 | Temp 97.9°F

## 2018-06-19 DIAGNOSIS — M25562 Pain in left knee: Secondary | ICD-10-CM

## 2018-06-19 DIAGNOSIS — M25561 Pain in right knee: Secondary | ICD-10-CM

## 2018-06-19 DIAGNOSIS — M5441 Lumbago with sciatica, right side: Secondary | ICD-10-CM

## 2018-06-19 DIAGNOSIS — M5416 Radiculopathy, lumbar region: Secondary | ICD-10-CM

## 2018-06-19 DIAGNOSIS — M5442 Lumbago with sciatica, left side: Secondary | ICD-10-CM

## 2018-06-19 DIAGNOSIS — G8929 Other chronic pain: Secondary | ICD-10-CM

## 2018-06-19 MED ORDER — BETAMETHASONE SOD PHOS & ACET 6 (3-3) MG/ML IJ SUSP
12.0000 mg | Freq: Once | INTRAMUSCULAR | Status: AC
  Administered 2018-06-19: 12 mg

## 2018-06-19 NOTE — Progress Notes (Signed)
 .  Numeric Pain Rating Scale and Functional Assessment Average Pain 7   In the last MONTH (on 0-10 scale) has pain interfered with the following?  1. General activity like being  able to carry out your everyday physical activities such as walking, climbing stairs, carrying groceries, or moving a chair?  Rating(4)   +Driver, -BT, -Dye Allergies.  

## 2018-06-19 NOTE — Patient Instructions (Signed)

## 2018-06-21 MED ORDER — PENNSAID 2 % TD SOLN: 2.0000 | Freq: Two times a day (BID) | TRANSDERMAL | 3 refills | Status: AC

## 2018-07-12 NOTE — Progress Notes (Signed)
Natalie Gray - 62 y.o. female MRN [id number]  Date of birth: 1956/07/27  Office Visit Note: Visit Date: 06/19/2018 PCP: Darci Needle, MD Referred by: Darci Needle, MD  Subjective: Chief Complaint  Patient presents with  . Lower Back - Pain  . Left Leg - Pain  . Left Foot - Numbness   HPI: Natalie Gray is a 62 y.o. female who comes in today For planned bilateral L3 transforaminal epidural steroid injection by way of review we saw the patient last year and completed S1 transforaminal injection for more radicular complaints on the one side and she did extremely well up until just recently.  She reports again lower back pain that radiates in the left leg with some numbness in the left foot.  Depending on relief we will look at MRI of the lumbar spine.  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  Patient continues on Celebrex and tramadol and duloxetine.  She does report compared to the last visit she did want to talk about her knees.  I did suggest to her that this was really ointment for injection and is hard to do a complete evaluation again but nonetheless she is having bilateral knee pain.  Is mostly medial knee pain worse with ambulating and going up and down stairs.  She has not noted any locking mechanism.  She does not feel like it is related to the lumbar spine issue.  She has had treatment in the past with injection and feels like her knees are just gotten worse.  She does not report any specific injury or prior surgery.  Again she is using Celebrex.  She is not using any bracing or had specific physical therapy.  Review of Systems  Constitutional: Negative for chills, fever, malaise/fatigue and weight loss.  HENT: Negative for hearing loss and sinus pain.   Eyes: Negative for blurred vision, double vision and photophobia.  Respiratory: Negative for cough and shortness of breath.     Cardiovascular: Negative for chest pain, palpitations and leg swelling.  Gastrointestinal: Negative for abdominal pain, nausea and vomiting.  Genitourinary: Negative for flank pain.  Musculoskeletal: Positive for back pain and joint pain. Negative for myalgias.  Skin: Negative for itching and rash.  Neurological: Negative for tremors, focal weakness and weakness.  Endo/Heme/Allergies: Negative.   Psychiatric/Behavioral: Negative for depression.  All other systems reviewed and are negative.  Otherwise per HPI.  Assessment & Plan: Visit Diagnoses:  1. Lumbar radiculopathy   2. Chronic pain of both knees   3. Chronic bilateral low back pain with bilateral sciatica     Plan: Findings:  Planned diagnostic and hopefully therapeutic L3 transforaminal injection bilaterally.  Very quick decision to obtain MRI should the patient not have much relief or if not last very long with if there is any questions about this.  Please see our prior evaluation for further details of this situation.  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  Deferred x-rays today.  Exam was really nonfocal there is no varus or valgus instability.  There is some joint line tenderness.  There appears to be no effusion.  She does have some crepitus with extension and flexion.  Negative Lockman test bilaterally.  Will prescribe Pennsaid2 and samples given.  Depending on results with her knees would have her seen in the office by Dr. Lavada Mesi.  Meds & Orders:  Meds ordered this encounter  Medications  . betamethasone acetate-betamethasone sodium phosphate (CELESTONE) injection 12 mg  . PENNSAID 2 % SOLN    Sig: Place 2 Pump onto the skin 2 (two) times daily.    Dispense:  1 Bottle    Refill:  3    Orders Placed This Encounter  Procedures  . XR C-ARM NO REPORT  . Epidural Steroid injection    Follow-up: Return if symptoms worsen or fail to improve.   Procedures: No  procedures performed  Lumbosacral Transforaminal Epidural Steroid Injection - Sub-Pedicular Approach with Fluoroscopic Guidance  Patient: Natalie Gray      Date of Birth: [birth year] MRN: [id number] PCP: Darci Needle, MD      Visit Date: 06/19/2018   Universal Protocol:    Date/Time: 06/19/2018  Consent Given By: the patient  Position: PRONE  Additional Comments: Vital signs were monitored before and after the procedure. Patient was prepped and draped in the usual sterile fashion. The correct patient, procedure, and site was verified.   Injection Procedure Details:  Procedure Site One Meds Administered:  Meds ordered this encounter  Medications  . betamethasone acetate-betamethasone sodium phosphate (CELESTONE) injection 12 mg  . PENNSAID 2 % SOLN    Sig: Place 2 Pump onto the skin 2 (two) times daily.    Dispense:  1 Bottle    Refill:  3    Laterality: Bilateral  Location/Site:  L3-L4  Needle size: 22 G  Needle type: Spinal  Needle Placement: Transforaminal  Findings:    -Comments: Excellent flow of contrast along the nerve and into the epidural space.  Procedure Details: After squaring off the end-plates to get a true AP view, the C-arm was positioned so that an oblique view of the foramen as noted above was visualized. The target area is just inferior to the "nose of the scotty dog" or sub pedicular. The soft tissues overlying this structure were infiltrated with 2-3 ml. of 1% Lidocaine without Epinephrine.  The spinal needle was inserted toward the target using a "trajectory" view along the fluoroscope beam.  Under AP and lateral visualization, the needle was advanced so it did not puncture dura and was located close the 6 O'Clock position of the pedical in AP tracterory. Biplanar projections were used to confirm position. Aspiration was confirmed to be negative for CSF and/or blood. A 1-2 ml. volume of Isovue-250 was injected and flow of contrast was noted at  each level. Radiographs were obtained for documentation purposes.   After attaining the desired flow of contrast documented above, a 0.5 to 1.0 ml test dose of 0.25% Marcaine was injected into each respective transforaminal space.  The patient was observed for 90 seconds post injection.  After no sensory deficits were reported, and normal lower extremity motor function was noted,   the above injectate was administered so that equal amounts of the injectate were placed at each foramen (level) into the transforaminal epidural space.   Additional Comments:  The patient tolerated the procedure well Dressing: Band-Aid    Post-procedure details: Patient was observed during the procedure. Post-procedure instructions were reviewed.  Patient left the clinic in stable condition.    Clinical History: No specialty comments available.   She reports that she has never smoked. She has never used smokeless tobacco. No results for input(s): HGBA1C, LABURIC in the last 8760 hours.  Objective:  VS:  HT:    WT:   BMI:     BP:106/64  HR:76bpm  TEMP:97.9 F (36.6 C)(Oral)  RESP:  Physical Exam Vitals signs and nursing note reviewed.  Constitutional:      General: She is not in acute distress.    Appearance: Normal appearance. She is well-developed. She is not ill-appearing.  HENT:     Head: Normocephalic and atraumatic.  Eyes:     Conjunctiva/sclera: Conjunctivae normal.     Pupils: Pupils are equal, round, and reactive to light.  Cardiovascular:     Rate and Rhythm: Normal rate.     Pulses: Normal pulses.  Pulmonary:     Effort: Pulmonary effort is normal.  Musculoskeletal:     Right lower leg: No edema.     Left lower leg: No edema.     Comments: Examination of both knees show good varus and valgus stability.  Negative Lockman test bilaterally.  No effusion noted.  Some joint line tenderness to palpation.  Some crepitus with extension flexion.  Skin:    General: Skin is warm and dry.      Findings: No erythema or rash.  Neurological:     General: No focal deficit present.     Mental Status: She is alert and oriented to person, place, and time.     Sensory: No sensory deficit.     Motor: No abnormal muscle tone.     Coordination: Coordination normal.     Gait: Gait normal.  Psychiatric:        Mood and Affect: Mood normal.        Behavior: Behavior normal.     Ortho Exam Imaging: No results found.  Past Medical/Family/Surgical/Social History: Medications & Allergies reviewed per EMR, new medications updated. There are no active problems to display for this patient.  Past Medical History:  Diagnosis Date  . Anxiety   . Arthritis   . Cardiomyopathy (HCC) 2012  . Depression   . Diabetes mellitus without complication (HCC)   . GERD (gastroesophageal reflux disease)   . Hypertension   . Neuropathy   . Pulmonary embolus (HCC)    with osteomylitis and ;arge dosages of Antibiotics through picc   History reviewed. No pertinent family history. Past Surgical History:  Procedure Laterality Date  . ACHILLES TENDON SURGERY Right 09/09/2015   Procedure: PERCUTANEOUS TENDOACHILLES LENGTHENING/KIDNER;  Surgeon: Ferman HammingBenjamin McKinney, DPM;  Location: [facility];  Service: Podiatry;  Laterality: Right;  . AMPUTATION Right 07/18/2013   Procedure: AMPUTATION 2ND TOE RIGHT FOOT ;  Surgeon: Dallas SchimkeBenjamin Ivan McKinney, DPM;  Location: [facility];  Service: Orthopedics;  Laterality: Right;  . AMPUTATION Right 06/11/2014   Procedure: AMPUTATION RIGHT GREAT TOE;  Surgeon: Dallas SchimkeBenjamin Ivan McKinney, DPM;  Location: [facility];  Service: Podiatry;  Laterality: Right;  . BUNIONECTOMY Bilateral   . DEBRIDEMENT TENNIS ELBOW Left   . FOOT ARTHRODESIS Right 07/18/2013   Procedure: REPAIR IPJ NON-UNION RIGHT HALLUX;  Surgeon: Dallas SchimkeBenjamin Ivan McKinney, DPM;  Location: [facility];  Service: Orthopedics;  Laterality: Right;  . FOOT BONE EXCISION Right    [facility]-2nd toe  . REFRACTIVE SURGERY    . TONSILLECTOMY    .  TRANSMETATARSAL AMPUTATION Right 09/09/2015   Procedure: TRANSMETATARSAL AMPUTATION RIGHT FOOT (AMPUTATION OF REMAINING TOES WITH PORTION OF METATARSALS, LENGTHENING OF ACHILLES TENDON OF RIGHT LOWER EXTREMITY, FILE BONE DOWN ON TOP OF RIGHT FOOT);  Surgeon: Ferman HammingBenjamin McKinney, DPM;  Location: [facility];  Service: Podiatry;  Laterality: Right;  . TUBAL LIGATION     Social History   Occupational History  . Not on file  Tobacco Use  . Smoking status: Never Smoker  . Smokeless tobacco: Never Used  Substance and Sexual Activity  . Alcohol use: No  . Drug use: No  . Sexual activity: Yes    Birth control/protection: Surgical

## 2018-07-12 NOTE — Procedures (Signed)
Lumbosacral Transforaminal Epidural Steroid Injection - Sub-Pedicular Approach with Fluoroscopic Guidance  Patient: Natalie Gray      Date of Birth: [birth year] MRN: [id number] PCP: Darci NeedleKohut, Walter, MD      Visit Date: 06/19/2018   Universal Protocol:    Date/Time: 06/19/2018  Consent Given By: the patient  Position: PRONE  Additional Comments: Vital signs were monitored before and after the procedure. Patient was prepped and draped in the usual sterile fashion. The correct patient, procedure, and site was verified.   Injection Procedure Details:  Procedure Site One Meds Administered:  Meds ordered this encounter  Medications  . betamethasone acetate-betamethasone sodium phosphate (CELESTONE) injection 12 mg  . PENNSAID 2 % SOLN    Sig: Place 2 Pump onto the skin 2 (two) times daily.    Dispense:  1 Bottle    Refill:  3    Laterality: Bilateral  Location/Site:  L3-L4  Needle size: 22 G  Needle type: Spinal  Needle Placement: Transforaminal  Findings:    -Comments: Excellent flow of contrast along the nerve and into the epidural space.  Procedure Details: After squaring off the end-plates to get a true AP view, the C-arm was positioned so that an oblique view of the foramen as noted above was visualized. The target area is just inferior to the "nose of the scotty dog" or sub pedicular. The soft tissues overlying this structure were infiltrated with 2-3 ml. of 1% Lidocaine without Epinephrine.  The spinal needle was inserted toward the target using a "trajectory" view along the fluoroscope beam.  Under AP and lateral visualization, the needle was advanced so it did not puncture dura and was located close the 6 O'Clock position of the pedical in AP tracterory. Biplanar projections were used to confirm position. Aspiration was confirmed to be negative for CSF and/or blood. A 1-2 ml. volume of Isovue-250 was injected and flow of contrast was noted at each level.  Radiographs were obtained for documentation purposes.   After attaining the desired flow of contrast documented above, a 0.5 to 1.0 ml test dose of 0.25% Marcaine was injected into each respective transforaminal space.  The patient was observed for 90 seconds post injection.  After no sensory deficits were reported, and normal lower extremity motor function was noted,   the above injectate was administered so that equal amounts of the injectate were placed at each foramen (level) into the transforaminal epidural space.   Additional Comments:  The patient tolerated the procedure well Dressing: Band-Aid    Post-procedure details: Patient was observed during the procedure. Post-procedure instructions were reviewed.  Patient left the clinic in stable condition.

## 2018-08-01 HISTORY — PX: IMPLANTABLE CARDIOVERTER DEFIBRILLATOR IMPLANT: SHX5860

## 2019-05-07 ENCOUNTER — Encounter (INDEPENDENT_AMBULATORY_CARE_PROVIDER_SITE_OTHER): Payer: Self-pay | Admitting: Physical Medicine and Rehabilitation

## 2019-05-07 NOTE — Progress Notes (Signed)
Natalie CoeDiane N Raiche - 63 y.o. female MRN [id number]  Date of birth: 02/05/1956  Office Visit Note: Visit Date: 06/07/2018 PCP: Darci NeedleKohut, Walter, MD Referred by: Darci NeedleKohut, Walter, MD  Subjective: Chief Complaint  Patient presents with  . Middle Back - Pain  . Lower Back - Pain   HPI: Natalie Gray is a 63 y.o. female who comes in today For reevaluation and management of chronic history of back pain in general.  We saw her in 2018 and completed S1 transforaminal epidural injection for pain in the buttock region that seemed to help quite a bit.  We have not seen her since then and she really has not had any other care for her lower back since that point.  She comes in today with really upper lumbar but she refers to this is mid back pain as well as lower lumbar pain.  She has had no falls or trauma.  She has had no bowel or bladder changes no real radicular complaints down the legs per se.  Some pain in the thighs.  She gets some pain with walking and ambulating.  She again has had no focal weakness.  She has had exercises in the past and she has tried medications including anti-inflammatories and muscle relaxer.  This is been ongoing now for several months.  She is really just been putting off coming in.  She reports some increasing with bending and lifting.  Get some improvement with heat and ice.  Pain is intermittent dull and aching.  She rates her pain as a 6 out of 10.  She has had imaging of the lumbar spine which we have reviewed.  She has not had MRI of the lumbar spine.  We will continue with medication management from her primary care physician or they may seek pain management referral if needed.  She does take Cymbalta and tramadol at times is taken Demerol.  Review of Systems  Constitutional: Negative for chills, fever, malaise/fatigue and weight loss.  HENT: Negative for hearing loss and sinus pain.   Eyes: Negative for blurred vision, double vision and photophobia.  Respiratory: Negative for  cough and shortness of breath.   Cardiovascular: Negative for chest pain, palpitations and leg swelling.  Gastrointestinal: Negative for abdominal pain, nausea and vomiting.  Genitourinary: Negative for flank pain.  Musculoskeletal: Positive for back pain. Negative for myalgias.  Skin: Negative for itching and rash.  Neurological: Negative for tremors, focal weakness and weakness.  Endo/Heme/Allergies: Negative.   Psychiatric/Behavioral: Negative for depression.  All other systems reviewed and are negative.  Otherwise per HPI.  Assessment & Plan: Visit Diagnoses:  1. Chronic bilateral low back pain without sciatica   2. Spondylosis without myelopathy or radiculopathy, lumbar region   3. Chronic midline thoracic back pain     Plan: Findings:  Chronic mechanical low back pain which is felt to be probably a combination of facet arthropathy and potentially stenosis or disc protrusion.  At this point do the severity of the pain and the fact that she did well with prior epidural injection, I think the best approach is L3 transforaminal injection based on imaging.  If she does extremely well with that I think we can watch and regroup with her with conservative care and medication and physical therapy.  If diagnostically this does not help very much she does need an MRI of the lumbar spine at this point as I saw her approximately 2 years ago we were saying the same thing at  the point then.  She has no red flag complaints and really no need for imaging today.    Meds & Orders: No orders of the defined types were placed in this encounter.  No orders of the defined types were placed in this encounter.   Follow-up: Return for Bilateral L3 transforaminal epidural steroid injection.   Procedures: No procedures performed  No notes on file   Clinical History: No specialty comments available.   She reports that she has never smoked. She has never used smokeless tobacco. No results for input(s):  HGBA1C, LABURIC in the last 8760 hours.  Objective:  VS:  HT:5\' 4"  (162.6 cm)   WT:180 lb (81.6 kg)  BMI:30.88    BP:(!) 122/91  HR:86bpm  TEMP: ( )  RESP:99 % Physical Exam Vitals signs and nursing note reviewed.  Constitutional:      General: She is not in acute distress.    Appearance: Normal appearance. She is well-developed. She is not ill-appearing.  HENT:     Head: Normocephalic and atraumatic.  Eyes:     Conjunctiva/sclera: Conjunctivae normal.     Pupils: Pupils are equal, round, and reactive to light.  Cardiovascular:     Rate and Rhythm: Normal rate.     Pulses: Normal pulses.  Pulmonary:     Effort: Pulmonary effort is normal.  Musculoskeletal:     Right lower leg: No edema.     Left lower leg: No edema.     Comments: Patient has pain in the paraspinal region with taut bands in the upper lumbar lower thoracic region.  She has pain with extension of the lumbar spine.  She has no pain with hip rotation she has good distal strength without numbness.  She has negative slump test bilaterally.  Sensation intact.  Skin:    General: Skin is warm and dry.     Findings: No erythema or rash.  Neurological:     General: No focal deficit present.     Mental Status: She is alert and oriented to person, place, and time.     Sensory: No sensory deficit.     Motor: No abnormal muscle tone.     Coordination: Coordination normal.     Gait: Gait normal.  Psychiatric:        Mood and Affect: Mood normal.        Behavior: Behavior normal.     Ortho Exam Imaging: No results found.  Past Medical/Family/Surgical/Social History: Medications & Allergies reviewed per EMR, new medications updated. There are no active problems to display for this patient.  Past Medical History:  Diagnosis Date  . Anxiety   . Arthritis   . Cardiomyopathy ([place]) 2012  . Depression   . Diabetes mellitus without complication ([place])   . GERD (gastroesophageal reflux disease)   . Hypertension   .  Neuropathy   . Pulmonary embolus ([facility])    with osteomylitis and ;arge dosages of Antibiotics through picc   History reviewed. No pertinent family history. Past Surgical History:  Procedure Laterality Date  . ACHILLES TENDON SURGERY Right 09/09/2015   Procedure: PERCUTANEOUS TENDOACHILLES LENGTHENING/KIDNER;  Surgeon: Caprice Beaver, DPM;  Location: [facility];  Service: Podiatry;  Laterality: Right;  . AMPUTATION Right 07/18/2013   Procedure: AMPUTATION 2ND TOE RIGHT FOOT ;  Surgeon: Marcheta Grammes, DPM;  Location: [facility];  Service: Orthopedics;  Laterality: Right;  . AMPUTATION Right 06/11/2014   Procedure: AMPUTATION RIGHT GREAT TOE;  Surgeon: Marcheta Grammes, DPM;  Location:  [facility];  Service: Podiatry;  Laterality: Right;  . BUNIONECTOMY Bilateral   . DEBRIDEMENT TENNIS ELBOW Left   . FOOT ARTHRODESIS Right 07/18/2013   Procedure: REPAIR IPJ NON-UNION RIGHT HALLUX;  Surgeon: Dallas Schimke, DPM;  Location: [facility];  Service: Orthopedics;  Laterality: Right;  . FOOT BONE EXCISION Right    [facility]-2nd toe  . REFRACTIVE SURGERY    . TONSILLECTOMY    . TRANSMETATARSAL AMPUTATION Right 09/09/2015   Procedure: TRANSMETATARSAL AMPUTATION RIGHT FOOT (AMPUTATION OF REMAINING TOES WITH PORTION OF METATARSALS, LENGTHENING OF ACHILLES TENDON OF RIGHT LOWER EXTREMITY, FILE BONE DOWN ON TOP OF RIGHT FOOT);  Surgeon: Ferman Hamming, DPM;  Location: [facility];  Service: Podiatry;  Laterality: Right;  . TUBAL LIGATION     Social History   Occupational History  . Not on file  Tobacco Use  . Smoking status: Never Smoker  . Smokeless tobacco: Never Used  Substance and Sexual Activity  . Alcohol use: No  . Drug use: No  . Sexual activity: Yes    Birth control/protection: Surgical

## 2020-03-17 DIAGNOSIS — E1143 Type 2 diabetes mellitus with diabetic autonomic (poly)neuropathy: Secondary | ICD-10-CM | POA: Insufficient documentation

## 2020-07-30 ENCOUNTER — Encounter (HOSPITAL_COMMUNITY): Payer: Self-pay | Admitting: Emergency Medicine

## 2020-07-30 ENCOUNTER — Other Ambulatory Visit: Payer: Self-pay

## 2020-07-30 ENCOUNTER — Emergency Department (HOSPITAL_COMMUNITY)
Admission: EM | Admit: 2020-07-30 | Discharge: 2020-07-30 | Discharge status: Against Medical Advice | Payer: BC Managed Care – PPO | Attending: Emergency Medicine | Admitting: Emergency Medicine

## 2020-07-30 DIAGNOSIS — Z5321 Procedure and treatment not carried out due to patient leaving prior to being seen by health care provider: Secondary | ICD-10-CM | POA: Insufficient documentation

## 2020-07-30 DIAGNOSIS — R079 Chest pain, unspecified: Secondary | ICD-10-CM | POA: Diagnosis present

## 2020-07-30 LAB — CBC
HCT: 38.6 % (ref 36.0–46.0)
Hemoglobin: 12.6 g/dL (ref 12.0–15.0)
MCH: 29.5 pg (ref 26.0–34.0)
MCHC: 32.6 g/dL (ref 30.0–36.0)
MCV: 90.4 fL (ref 80.0–100.0)
Platelets: 228 10*3/uL (ref 150–400)
RBC: 4.27 MIL/uL (ref 3.87–5.11)
RDW: 12.5 % (ref 11.5–15.5)
WBC: 7.2 10*3/uL (ref 4.0–10.5)
nRBC: 0 % (ref 0.0–0.2)

## 2020-07-30 LAB — BASIC METABOLIC PANEL
Anion gap: 8 (ref 5–15)
BUN: 23 mg/dL (ref 8–23)
CO2: 24 mmol/L (ref 22–32)
Calcium: 9 mg/dL (ref 8.9–10.3)
Chloride: 106 mmol/L (ref 98–111)
Creatinine, Ser: 1.15 mg/dL — ABNORMAL HIGH (ref 0.44–1.00)
GFR, Estimated: 54 mL/min — ABNORMAL LOW (ref >60)
Glucose, Bld: 134 mg/dL — ABNORMAL HIGH (ref 70–99)
Potassium: 4.7 mmol/L (ref 3.5–5.1)
Sodium: 138 mmol/L (ref 135–145)

## 2020-07-30 LAB — TROPONIN I (HIGH SENSITIVITY): Troponin I (High Sensitivity): 3 ng/L (ref <18)

## 2020-07-30 NOTE — ED Notes (Signed)
Pt told screeners she was leaving.  °

## 2020-07-30 NOTE — ED Triage Notes (Signed)
Pt states she was seen by her PCP today and had chest pain. EKG was performed and she was told to come here because of abnormalities. Alert and oriented.

## 2021-02-24 ENCOUNTER — Telehealth: Payer: Self-pay | Admitting: Physical Medicine and Rehabilitation

## 2021-02-24 NOTE — Telephone Encounter (Signed)
Patient called needing to schedule and appointment with Dr Alvester Morin. The number to contact patient is [phone]

## 2021-02-25 NOTE — Telephone Encounter (Signed)
Last seen by Dr. Alvester Morin in 2019. Scheduled for office visit with you on 8/1 at 1030.

## 2021-03-01 ENCOUNTER — Ambulatory Visit (INDEPENDENT_AMBULATORY_CARE_PROVIDER_SITE_OTHER): Payer: BC Managed Care – PPO | Admitting: Physical Medicine and Rehabilitation

## 2021-03-01 ENCOUNTER — Other Ambulatory Visit: Payer: Self-pay

## 2021-03-01 ENCOUNTER — Telehealth: Payer: Self-pay | Admitting: Physical Medicine and Rehabilitation

## 2021-03-01 ENCOUNTER — Encounter: Payer: Self-pay | Admitting: Physical Medicine and Rehabilitation

## 2021-03-01 VITALS — BP 106/73 | HR 66

## 2021-03-01 DIAGNOSIS — M48062 Spinal stenosis, lumbar region with neurogenic claudication: Secondary | ICD-10-CM | POA: Diagnosis not present

## 2021-03-01 DIAGNOSIS — M5416 Radiculopathy, lumbar region: Secondary | ICD-10-CM | POA: Diagnosis not present

## 2021-03-01 DIAGNOSIS — M47816 Spondylosis without myelopathy or radiculopathy, lumbar region: Secondary | ICD-10-CM

## 2021-03-01 DIAGNOSIS — G894 Chronic pain syndrome: Secondary | ICD-10-CM | POA: Diagnosis not present

## 2021-03-01 NOTE — Progress Notes (Signed)
Pt state lower back pain that travels down her right leg. Pt state she has pain in her mid back. Pt state laying, walking and standing makes the pain worse. Pt state she take pain meds and uses ice to help ease her pain.  Numeric Pain Rating Scale and Functional Assessment Average Pain 10 Pain Right Now 7 My pain is intermittent, sharp, tingling, and aching Pain is worse with: walking, standing, and some activites Pain improves with: rest and medication   In the last MONTH (on 0-10 scale) has pain interfered with the following?  1. General activity like being  able to carry out your everyday physical activities such as walking, climbing stairs, carrying groceries, or moving a chair?  Rating(7)  2. Relation with others like being able to carry out your usual social activities and roles such as  activities at home, at work and in your community. Rating(8)  3. Enjoyment of life such that you have  been bothered by emotional problems such as feeling anxious, depressed or irritable?  Rating(9)

## 2021-03-01 NOTE — Progress Notes (Signed)
Natalie Gray - 65 y.o. female MRN [id number]  Date of birth: 09/03/1955  Office Visit Note: Visit Date: 03/01/2021 PCP: Merri Brunette, MD Referred by: Merri Brunette, MD  Subjective: Chief Complaint  Patient presents with   Lower Back - Pain   Right Leg - Pain   HPI: Natalie Gray is a 65 y.o. female who comes in today for evaluation of chronic, worsening and severe right-sided lower back pain radiating to the buttock and posterior leg down to heel. Patient reports increased pain the last 2 weeks, describes as sharp in nature and rates as 7 out of 10 at present. Patient reports pain is exacerbated by walking and prolonged standing. Patient reports some pain relief with use of medications, stretching exercises and ice. Patient's CT of lumbar spine from 2019 exhibits age indeterminate compression fracture at L1, canal stenosis at L3-L4, L4-L5 and multi-level facet hypertrophy.  Patient received bilateral L3-L4 transforaminal epidural steroid injection in 2019, which she reports gave her significant and sustained pain relief.  Patient received left L1 transforaminal epidural steroid injection in 2018, which she also states significantly helped to relieve her pain. Overall she reports good success with epidural steroid injections and would like to schedule an injection before her vacation in a few weeks. Patient is currently in pain management program at [facility] and reports she is doing well managing her chronic pain issues. Patient is followed by Dr. Marda Stalker with cardiology in [place], [place] whom manages her ICD and Optimizer implanted device.  Patient denies focal weakness, numbness and tingling.  Patient denies any recent trauma or falls.  Patient's course is complicated by chronic pain syndrome, diabetes mellitus, chronic kidney disease, and heart failure.   Review of Systems  Musculoskeletal:  Positive for back pain.  Neurological:  Negative for tingling and focal  weakness.  All other systems reviewed and are negative. Otherwise per HPI.  Assessment & Plan: Visit Diagnoses:    ICD-10-CM   1. Lumbar radiculopathy  M54.16     2. Spinal stenosis of lumbar region with neurogenic claudication  M48.062     3. Facet hypertrophy of lumbar region  M47.816     4. Chronic pain syndrome  G89.4        Plan: Findings:  Chronic, worsening and severe right lower back pain radiating to buttock, posterior leg and down to heel. Patient's imaging and exam are consistent with classic S1 nerve distribution. Patient had good success and sustained pain relief with previous epidural steroid injections. We believe the next step is to perform a diagnostic and hopefully therapeutic right S1-S2 transforaminal epidural steroid injection. Patient has lumbar MRI ordered by Dr. Marda Stalker and will follow-up with patient after imaging is completed. She is currently waiting on prior authorization from [employer], patient informed that we are also happy to order lumbar MRI if she is unable to get this completed in [place]. Depending on results of lumbar MRI and pain relief we would consider a different type of epidural steroid injection or possible facet joint blocks. Patient encouraged to continue conservative therapies at home and to follow-up with Dr. Norlene Campbell for chronic orthopedic issues. No red flag symptoms noted upon exam.    Meds & Orders: No orders of the defined types were placed in this encounter.  No orders of the defined types were placed in this encounter.   Follow-up: Return in about 1 week (around 03/08/2021) for Right S1-S2 transforaminal steroid injection.   Procedures: No  procedures performed      Clinical History: No specialty comments available.   She reports that she has never smoked. She has never used smokeless tobacco. No results for input(s): HGBA1C, LABURIC in the last 8760 hours.  Objective:  VS:  HT:    WT:   BMI:     BP:106/73   HR:66bpm  TEMP: ( )  RESP:  Physical Exam HENT:     Head: Normocephalic and atraumatic.     Right Ear: Tympanic membrane normal.     Left Ear: Tympanic membrane normal.     Nose: Nose normal.     Mouth/Throat:     Mouth: Mucous membranes are moist.  Eyes:     Pupils: Pupils are equal, round, and reactive to light.  Cardiovascular:     Rate and Rhythm: Normal rate.     Pulses: Normal pulses.  Pulmonary:     Effort: Pulmonary effort is normal.  Abdominal:     General: Abdomen is flat. There is no distension.  Musculoskeletal:     Cervical back: Normal range of motion and neck supple.     Comments: Pt is slow to rise from seated position to standing without difficulty. Good lumbar range of motion. Strong distal strength without clonus, no pain upon palpation of greater trochanters. Pain noted upon palpation of underlying musculature on right paraspinal region. Sensation intact bilaterally. Walks independently, gait steady.    Skin:    General: Skin is warm and dry.     Capillary Refill: Capillary refill takes less than 2 seconds.  Neurological:     General: No focal deficit present.     Mental Status: She is alert.  Psychiatric:        Mood and Affect: Mood normal.    Ortho Exam  Imaging: No results found.  Past Medical/Family/Surgical/Social History: Medications & Allergies reviewed per EMR, new medications updated. There are no problems to display for this patient.  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 No pertinent family history. Past Surgical History:  Procedure Laterality Date   ACHILLES TENDON SURGERY Right 09/09/2015   Procedure: PERCUTANEOUS TENDOACHILLES  LENGTHENING/KIDNER;  Surgeon: Ferman Hamming, DPM;  Location: [facility];  Service: Podiatry;  Laterality: Right;   AMPUTATION Right 07/18/2013   Procedure: AMPUTATION 2ND TOE RIGHT FOOT ;  Surgeon: Dallas Schimke, DPM;  Location: [facility];  Service: Orthopedics;  Laterality: Right;   AMPUTATION Right 06/11/2014   Procedure: AMPUTATION RIGHT GREAT TOE;  Surgeon: Dallas Schimke, DPM;  Location: [facility];  Service: Podiatry;  Laterality: Right;   BUNIONECTOMY Bilateral    DEBRIDEMENT TENNIS ELBOW Left    FOOT ARTHRODESIS Right 07/18/2013   Procedure: REPAIR IPJ NON-UNION RIGHT HALLUX;  Surgeon: Dallas Schimke, DPM;  Location: [facility];  Service: Orthopedics;  Laterality: Right;   FOOT BONE EXCISION Right    [facility]-2nd toe   REFRACTIVE SURGERY     TONSILLECTOMY     TRANSMETATARSAL AMPUTATION Right 09/09/2015   Procedure: TRANSMETATARSAL AMPUTATION RIGHT FOOT (AMPUTATION OF REMAINING TOES WITH PORTION OF METATARSALS, LENGTHENING OF ACHILLES TENDON OF RIGHT LOWER EXTREMITY, FILE BONE DOWN ON TOP OF RIGHT  FOOT);  Surgeon: Ferman Hamming, DPM;  Location: [facility];  Service: Podiatry;  Laterality: Right;   TUBAL LIGATION     Social History   Occupational History   Not on file  Tobacco Use   Smoking status: Never   Smokeless tobacco: Never  Substance and Sexual Activity   Alcohol use: No   Drug use: No   Sexual activity: Yes    Birth control/protection: Surgical

## 2021-03-01 NOTE — Telephone Encounter (Signed)
Is auth needed for left S1 TF? Scheduled for 8/4 with driver.

## 2021-03-04 ENCOUNTER — Ambulatory Visit (INDEPENDENT_AMBULATORY_CARE_PROVIDER_SITE_OTHER): Payer: BC Managed Care – PPO | Admitting: Physical Medicine and Rehabilitation

## 2021-03-04 ENCOUNTER — Encounter: Payer: Self-pay | Admitting: Physical Medicine and Rehabilitation

## 2021-03-04 ENCOUNTER — Other Ambulatory Visit: Payer: Self-pay

## 2021-03-04 ENCOUNTER — Ambulatory Visit: Payer: Self-pay

## 2021-03-04 VITALS — BP 109/56 | HR 69

## 2021-03-04 DIAGNOSIS — M5416 Radiculopathy, lumbar region: Secondary | ICD-10-CM | POA: Diagnosis not present

## 2021-03-04 MED ORDER — METHYLPREDNISOLONE ACETATE 80 MG/ML IJ SUSP
80.0000 mg | Freq: Once | INTRAMUSCULAR | Status: AC
  Administered 2021-03-04: 80 mg

## 2021-03-04 NOTE — Progress Notes (Signed)
Pt state lower back pain that travels down her right buttock and leg. Pt state walking and standing makes the pain worse. Pt state she takes over the counter pain meds and uses ice to help ease her pain.  Numeric Pain Rating Scale and Functional Assessment Average Pain 7   In the last MONTH (on 0-10 scale) has pain interfered with the following?  1. General activity like being  able to carry out your everyday physical activities such as walking, climbing stairs, carrying groceries, or moving a chair?  Rating(10)   +Driver, -BT, -Dye Allergies.

## 2021-03-04 NOTE — Patient Instructions (Signed)

## 2021-03-22 NOTE — Procedures (Signed)
S1 Lumbosacral Transforaminal Epidural Steroid Injection - Sub-Pedicular Approach with Fluoroscopic Guidance   Patient: Natalie Gray      Date of Birth: 01/23/1956 MRN: [id number] PCP: Merri Brunette, MD      Visit Date: 03/04/2021   Universal Protocol:    Date/Time: 08/22/226:48 AM  Consent Given By: the patient  Position:  PRONE  Additional Comments: Vital signs were monitored before and after the procedure. Patient was prepped and draped in the usual sterile fashion. The correct patient, procedure, and site was verified.   Injection Procedure Details:  Procedure Site One Meds Administered:  Meds ordered this encounter  Medications   methylPREDNISolone acetate (DEPO-MEDROL) injection 80 mg    Laterality: Left  Location/Site:  S1 Foramen   Needle size: 22 ga.  Needle type: Spinal  Needle Placement: Transforaminal  Findings:   -Comments: Excellent flow of contrast along the nerve, nerve root and into the epidural space.  Epidurogram: Contrast epidurogram showed no nerve root cut off or restricted flow pattern.  Procedure Details: After squaring off the sacral end-plate to get a true AP view, the C-arm was positioned so that the best possible view of the S1 foramen was visualized. The soft tissues overlying this structure were infiltrated with 2-3 ml. of 1% Lidocaine without Epinephrine.    The spinal needle was inserted toward the target using a "trajectory" view along the fluoroscope beam.  Under AP and lateral visualization, the needle was advanced so it did not puncture dura. Biplanar projections were used to confirm position. Aspiration was confirmed to be negative for CSF and/or blood. A 1-2 ml. volume of Isovue-250 was injected and flow of contrast was noted at each level. Radiographs were obtained for documentation purposes.   After attaining the desired flow of contrast documented above, a 0.5 to 1.0 ml test dose of 0.25% Marcaine was injected into each  respective transforaminal space.  The patient was observed for 90 seconds post injection.  After no sensory deficits were reported, and normal lower extremity motor function was noted,   the above injectate was administered so that equal amounts of the injectate were placed at each foramen (level) into the transforaminal epidural space.   Additional Comments:  The patient tolerated the procedure well Dressing: Band-Aid with 2 x 2 sterile gauze    Post-procedure details: Patient was observed during the procedure. Post-procedure instructions were reviewed.  Patient left the clinic in stable condition.

## 2021-03-22 NOTE — Progress Notes (Signed)
Natalie Gray - 65 y.o. female MRN [id number]  Date of birth: [birth year]  Office Visit Note: Visit Date: 03/04/2021 PCP: Merri Brunette, MD Referred by: Merri Brunette, MD  Subjective: Chief Complaint  Patient presents with   Lower Back - Pain   Right Leg - Pain   HPI:  Natalie Gray is a 65 y.o. female who comes in today for planned Right S1-2 Lumbar Transforaminal epidural steroid injection with fluoroscopic guidance.  The patient has failed conservative care including home exercise, medications, time and activity modification.  This injection will be diagnostic and hopefully therapeutic.  Please see requesting physician notes for further details and justification.   ROS Otherwise per HPI.  Assessment & Plan: Visit Diagnoses:    ICD-10-CM   1. Lumbar radiculopathy  M54.16 XR C-ARM NO REPORT    Epidural Steroid injection    methylPREDNISolone acetate (DEPO-MEDROL) injection 80 mg      Plan: No additional findings.   Meds & Orders:  Meds ordered this encounter  Medications   methylPREDNISolone acetate (DEPO-MEDROL) injection 80 mg    Orders Placed This Encounter  Procedures   XR C-ARM NO REPORT   Epidural Steroid injection    Follow-up: Return if symptoms worsen or fail to improve.   Procedures: No procedures performed  S1 Lumbosacral Transforaminal Epidural Steroid Injection - Sub-Pedicular Approach with Fluoroscopic Guidance   Patient: Natalie Gray      Date of Birth: 12/26/1955 MRN: [id number] PCP: Merri Brunette, MD      Visit Date: 03/04/2021   Universal Protocol:    Date/Time: 08/22/226:48 AM  Consent Given By: the patient  Position:  PRONE  Additional Comments: Vital signs were monitored before and after the procedure. Patient was prepped and draped in the usual sterile fashion. The correct patient, procedure, and site was verified.   Injection Procedure Details:  Procedure Site One Meds Administered:  Meds ordered this encounter   Medications   methylPREDNISolone acetate (DEPO-MEDROL) injection 80 mg    Laterality: Left  Location/Site:  S1 Foramen   Needle size: 22 ga.  Needle type: Spinal  Needle Placement: Transforaminal  Findings:   -Comments: Excellent flow of contrast along the nerve, nerve root and into the epidural space.  Epidurogram: Contrast epidurogram showed no nerve root cut off or restricted flow pattern.  Procedure Details: After squaring off the sacral end-plate to get a true AP view, the C-arm was positioned so that the best possible view of the S1 foramen was visualized. The soft tissues overlying this structure were infiltrated with 2-3 ml. of 1% Lidocaine without Epinephrine.    The spinal needle was inserted toward the target using a "trajectory" view along the fluoroscope beam.  Under AP and lateral visualization, the needle was advanced so it did not puncture dura. Biplanar projections were used to confirm position. Aspiration was confirmed to be negative for CSF and/or blood. A 1-2 ml. volume of Isovue-250 was injected and flow of contrast was noted at each level. Radiographs were obtained for documentation purposes.   After attaining the desired flow of contrast documented above, a 0.5 to 1.0 ml test dose of 0.25% Marcaine was injected into each respective transforaminal space.  The patient was observed for 90 seconds post injection.  After no sensory deficits were reported, and normal lower extremity motor function was noted,   the above injectate was administered so that equal amounts of the injectate were placed at each foramen (level) into the transforaminal epidural space.  Additional Comments:  The patient tolerated the procedure well Dressing: Band-Aid with 2 x 2 sterile gauze    Post-procedure details: Patient was observed during the procedure. Post-procedure instructions were reviewed.  Patient left the clinic in stable condition.   Clinical History: No specialty  comments available.     Objective:  VS:  HT:    WT:   BMI:     BP:(!) 109/56  HR:69bpm  TEMP: ( )  RESP:  Physical Exam Vitals and nursing note reviewed.  Constitutional:      General: She is not in acute distress.    Appearance: Normal appearance. She is not ill-appearing.  HENT:     Head: Normocephalic and atraumatic.     Right Ear: External ear normal.     Left Ear: External ear normal.  Eyes:     Extraocular Movements: Extraocular movements intact.  Cardiovascular:     Rate and Rhythm: Normal rate.     Pulses: Normal pulses.  Pulmonary:     Effort: Pulmonary effort is normal. No respiratory distress.  Abdominal:     General: There is no distension.     Palpations: Abdomen is soft.  Musculoskeletal:        General: Tenderness present.     Cervical back: Neck supple.     Right lower leg: No edema.     Left lower leg: No edema.     Comments: Patient has good distal strength with no pain over the greater trochanters.  No clonus or focal weakness.  Skin:    Findings: No erythema, lesion or rash.  Neurological:     General: No focal deficit present.     Mental Status: She is alert and oriented to person, place, and time.     Sensory: No sensory deficit.     Motor: No weakness or abnormal muscle tone.     Coordination: Coordination normal.  Psychiatric:        Mood and Affect: Mood normal.        Behavior: Behavior normal.     Imaging: No results found.

## 2021-08-24 ENCOUNTER — Telehealth: Payer: Self-pay | Admitting: Physical Medicine and Rehabilitation

## 2021-08-24 NOTE — Telephone Encounter (Signed)
Pt called. Last seen 03/04/21 for Right S1 TF. Would like to repeat injection?

## 2021-08-24 NOTE — Telephone Encounter (Signed)
Pt states she never got MRI done. Also injection helped for 4 months. Sch appt on 09/07/2021

## 2021-09-07 ENCOUNTER — Other Ambulatory Visit: Payer: Self-pay

## 2021-09-07 ENCOUNTER — Ambulatory Visit: Payer: Self-pay

## 2021-09-07 ENCOUNTER — Ambulatory Visit: Payer: BC Managed Care – PPO | Admitting: Physical Medicine and Rehabilitation

## 2021-09-07 ENCOUNTER — Encounter: Payer: Self-pay | Admitting: Physical Medicine and Rehabilitation

## 2021-09-07 VITALS — BP 108/66 | HR 67

## 2021-09-07 DIAGNOSIS — M5416 Radiculopathy, lumbar region: Secondary | ICD-10-CM

## 2021-09-07 MED ORDER — METHYLPREDNISOLONE ACETATE 80 MG/ML IJ SUSP
80.0000 mg | Freq: Once | INTRAMUSCULAR | Status: AC
Start: 2021-09-07 — End: 2021-09-07
  Administered 2021-09-07: 80 mg

## 2021-09-07 NOTE — Progress Notes (Signed)
Pt state lower back pain that travels down her left buttock and leg. Pt state walking and standing makes the pain worse. Pt state she takes over the counter pain meds and uses ice to help ease her pain.  Numeric Pain Rating Scale and Functional Assessment Average Pain 8   In the last MONTH (on 0-10 scale) has pain interfered with the following?  1. General activity like being  able to carry out your everyday physical activities such as walking, climbing stairs, carrying groceries, or moving a chair?  Rating(10)   +Driver, -BT, -Dye Allergies.

## 2021-09-07 NOTE — Patient Instructions (Signed)

## 2021-09-11 NOTE — Procedures (Signed)
S1 Lumbosacral Transforaminal Epidural Steroid Injection - Sub-Pedicular Approach with Fluoroscopic Guidance   Patient: Natalie Gray      Date of Birth: 09-Apr-1956 MRN: [id number] PCP: Merri Brunette, MD      Visit Date: 09/07/2021   Universal Protocol:    Date/Time: 02/11/231:20 PM  Consent Given By: the patient  Position:  PRONE  Additional Comments: Vital signs were monitored before and after the procedure. Patient was prepped and draped in the usual sterile fashion. The correct patient, procedure, and site was verified.   Injection Procedure Details:  Procedure Site One Meds Administered:  Meds ordered this encounter  Medications   methylPREDNISolone acetate (DEPO-MEDROL) injection 80 mg    Laterality: Left  Location/Site:  S1 Foramen   Needle size: 22 ga.  Needle type: Spinal  Needle Placement: Transforaminal  Findings:   -Comments: Excellent flow of contrast along the nerve, nerve root and into the epidural space.  Epidurogram: Contrast epidurogram showed no nerve root cut off or restricted flow pattern.  Procedure Details: After squaring off the sacral end-plate to get a true AP view, the C-arm was positioned so that the best possible view of the S1 foramen was visualized. The soft tissues overlying this structure were infiltrated with 2-3 ml. of 1% Lidocaine without Epinephrine.    The spinal needle was inserted toward the target using a "trajectory" view along the fluoroscope beam.  Under AP and lateral visualization, the needle was advanced so it did not puncture dura. Biplanar projections were used to confirm position. Aspiration was confirmed to be negative for CSF and/or blood. A 1-2 ml. volume of Isovue-250 was injected and flow of contrast was noted at each level. Radiographs were obtained for documentation purposes.   After attaining the desired flow of contrast documented above, a 0.5 to 1.0 ml test dose of 0.25% Marcaine was injected into each  respective transforaminal space.  The patient was observed for 90 seconds post injection.  After no sensory deficits were reported, and normal lower extremity motor function was noted,   the above injectate was administered so that equal amounts of the injectate were placed at each foramen (level) into the transforaminal epidural space.   Additional Comments:  No complications occurred Dressing: Band-Aid with 2 x 2 sterile gauze    Post-procedure details: Patient was observed during the procedure. Post-procedure instructions were reviewed.  Patient left the clinic in stable condition.

## 2021-09-11 NOTE — Progress Notes (Signed)
NOOR WITTE - 66 y.o. female MRN [id number]  Date of birth: 1956/07/30  Office Visit Note: Visit Date: 09/07/2021 PCP: Merri Brunette, MD Referred by: Merri Brunette, MD  Subjective: Chief Complaint  Patient presents with   Lower Back - Pain   Left Leg - Pain   HPI:  GARGI BERCH is a 66 y.o. female who comes in today for planned repeat Left S1-2  Lumbar Transforaminal epidural steroid injection with fluoroscopic guidance.  The patient has failed conservative care including home exercise, medications, time and activity modification.  This injection will be diagnostic and hopefully therapeutic.  Please see requesting physician notes for further details and justification. Patient received more than 50% pain relief from prior injection.  Symptoms are fairly classic S1 distribution dermatome.  Negative slump test.  Prior CT scan 2019 showed stenosis centrally at L3-4 and L4-5.  If she does not get relief long-term with this would look at MRI of the lumbar spine versus CT myelogram.  Referring: Ellin Goodie, FNP   ROS Otherwise per HPI.  Assessment & Plan: Visit Diagnoses:    ICD-10-CM   1. Lumbar radiculopathy  M54.16 XR C-ARM NO REPORT    Epidural Steroid injection    methylPREDNISolone acetate (DEPO-MEDROL) injection 80 mg      Plan: No additional findings.   Meds & Orders:  Meds ordered this encounter  Medications   methylPREDNISolone acetate (DEPO-MEDROL) injection 80 mg    Orders Placed This Encounter  Procedures   XR C-ARM NO REPORT   Epidural Steroid injection    Follow-up: Return if symptoms worsen or fail to improve.   Procedures: No procedures performed  S1 Lumbosacral Transforaminal Epidural Steroid Injection - Sub-Pedicular Approach with Fluoroscopic Guidance   Patient: KEVIANA GUIDA      Date of Birth: 1955-11-14 MRN: [id number] PCP: Merri Brunette, MD      Visit Date: 09/07/2021   Universal Protocol:    Date/Time: 02/11/231:20 PM  Consent Given  By: the patient  Position:  PRONE  Additional Comments: Vital signs were monitored before and after the procedure. Patient was prepped and draped in the usual sterile fashion. The correct patient, procedure, and site was verified.   Injection Procedure Details:  Procedure Site One Meds Administered:  Meds ordered this encounter  Medications   methylPREDNISolone acetate (DEPO-MEDROL) injection 80 mg    Laterality: Left  Location/Site:  S1 Foramen   Needle size: 22 ga.  Needle type: Spinal  Needle Placement: Transforaminal  Findings:   -Comments: Excellent flow of contrast along the nerve, nerve root and into the epidural space.  Epidurogram: Contrast epidurogram showed no nerve root cut off or restricted flow pattern.  Procedure Details: After squaring off the sacral end-plate to get a true AP view, the C-arm was positioned so that the best possible view of the S1 foramen was visualized. The soft tissues overlying this structure were infiltrated with 2-3 ml. of 1% Lidocaine without Epinephrine.    The spinal needle was inserted toward the target using a "trajectory" view along the fluoroscope beam.  Under AP and lateral visualization, the needle was advanced so it did not puncture dura. Biplanar projections were used to confirm position. Aspiration was confirmed to be negative for CSF and/or blood. A 1-2 ml. volume of Isovue-250 was injected and flow of contrast was noted at each level. Radiographs were obtained for documentation purposes.   After attaining the desired flow of contrast documented above, a 0.5 to 1.0 ml test  dose of 0.25% Marcaine was injected into each respective transforaminal space.  The patient was observed for 90 seconds post injection.  After no sensory deficits were reported, and normal lower extremity motor function was noted,   the above injectate was administered so that equal amounts of the injectate were placed at each foramen (level) into the  transforaminal epidural space.   Additional Comments:  No complications occurred Dressing: Band-Aid with 2 x 2 sterile gauze    Post-procedure details: Patient was observed during the procedure. Post-procedure instructions were reviewed.  Patient left the clinic in stable condition.   Clinical History: No specialty comments available.     Objective:  VS:  HT:     WT:    BMI:      BP:108/66   HR:67bpm   TEMP: ( )   RESP:  Physical Exam Vitals and nursing note reviewed.  Constitutional:      General: She is not in acute distress.    Appearance: Normal appearance. She is not ill-appearing.  HENT:     Head: Normocephalic and atraumatic.     Right Ear: External ear normal.     Left Ear: External ear normal.  Eyes:     Extraocular Movements: Extraocular movements intact.  Cardiovascular:     Rate and Rhythm: Normal rate.     Pulses: Normal pulses.  Pulmonary:     Effort: Pulmonary effort is normal. No respiratory distress.  Abdominal:     General: There is no distension.     Palpations: Abdomen is soft.  Musculoskeletal:        General: Tenderness present.     Cervical back: Neck supple.     Right lower leg: No edema.     Left lower leg: No edema.     Comments: Patient has good distal strength with no pain over the greater trochanters.  No clonus or focal weakness.  Skin:    Findings: No erythema, lesion or rash.  Neurological:     General: No focal deficit present.     Mental Status: She is alert and oriented to person, place, and time.     Sensory: No sensory deficit.     Motor: No weakness or abnormal muscle tone.     Coordination: Coordination normal.  Psychiatric:        Mood and Affect: Mood normal.        Behavior: Behavior normal.     Imaging: No results found.

## 2021-11-16 ENCOUNTER — Encounter (HOSPITAL_COMMUNITY): Payer: Self-pay

## 2021-11-16 ENCOUNTER — Emergency Department (HOSPITAL_COMMUNITY)
Admission: EM | Admit: 2021-11-16 | Discharge: 2021-11-17 | Disposition: A | Discharge status: Home / Self Care | Payer: BC Managed Care – PPO | Attending: Emergency Medicine | Admitting: Emergency Medicine

## 2021-11-16 ENCOUNTER — Other Ambulatory Visit: Payer: Self-pay

## 2021-11-16 DIAGNOSIS — I10 Essential (primary) hypertension: Secondary | ICD-10-CM | POA: Diagnosis not present

## 2021-11-16 DIAGNOSIS — Z79899 Other long term (current) drug therapy: Secondary | ICD-10-CM | POA: Diagnosis not present

## 2021-11-16 DIAGNOSIS — E1140 Type 2 diabetes mellitus with diabetic neuropathy, unspecified: Secondary | ICD-10-CM | POA: Diagnosis not present

## 2021-11-16 DIAGNOSIS — Z7984 Long term (current) use of oral hypoglycemic drugs: Secondary | ICD-10-CM | POA: Diagnosis not present

## 2021-11-16 DIAGNOSIS — M79672 Pain in left foot: Secondary | ICD-10-CM | POA: Insufficient documentation

## 2021-11-16 MED ORDER — HYDROMORPHONE HCL 2 MG/ML IJ SOLN
2.0000 mg | Freq: Once | INTRAMUSCULAR | Status: AC
  Administered 2021-11-17: 2 mg via INTRAMUSCULAR
  Filled 2021-11-16: qty 1

## 2021-11-16 NOTE — ED Notes (Signed)
Ambulatory to room

## 2021-11-16 NOTE — ED Triage Notes (Signed)
Pt c/o "stabbing pain" in her left foot that started this evening. States she took two OxyContin with no relief. Denies any injury. Ambulatory in triage. ? ?Hx of neuropathy. ?

## 2021-11-16 NOTE — ED Provider Notes (Signed)
?[facility] ?Provider Note ? ? ?CSN: [id number] ?Arrival date & time: 11/16/21  2249 ? ?  ? ?History ? ?No chief complaint on file. ? ? ?Natalie Gray is a 66 y.o. female. ? ?The history is provided by the patient.  ?Foot Pain ?This is a new problem. The current episode started 3 to 5 hours ago. The problem occurs constantly. The problem has been rapidly worsening. The symptoms are aggravated by walking. The symptoms are relieved by rest.  ?Patient reports she is having intense left foot pain ?Patient reports he has a history of Charcot foot. ?She reports he has previously had amputation part of the right foot. ?Several hours ago she had sudden onset of left foot pain and burning that she thinks is neuropathy.  No trauma.  No fevers or chills.  No swelling is reported.  She is able to ambulate but it hurts ?  ?Past Medical History:  ?Diagnosis Date  ? Anxiety   ? Arthritis   ? Cardiomyopathy [facility](HCC) 2012  ? Depression   ? Diabetes mellitus without complication (HCC)   ? GERD (gastroesophageal reflux disease)   ? Hypertension   ? Neuropathy   ? Pulmonary embolus (HCC)   ? with osteomylitis and ;arge dosages of Antibiotics through [facility]  ? ? ?Home Medications ?Prior to Admission medications   ?Medication Sig Start Date End Date Taking? Authorizing Provider  ?ALPRAZolam (XANAX) 0.25 MG tablet Take 0.25 mg by mouth at bedtime as needed for sleep or anxiety.    [provider]  ?carvedilol (COREG) 6.25 MG tablet Take 1 tablet by mouth 2 (two) times daily. 05/20/14   [provider]  ?celecoxib (CELEBREX) 100 MG capsule Take 100 mg by mouth 2 (two) times daily.    [provider]  ?cholecalciferol (VITAMIN D) 1000 UNITS tablet Take 1,000 Units by mouth daily.    [provider]  ?clindamycin (CLEOCIN) 300 MG capsule Take 1 capsule (300 mg total) by mouth 3 (three) times daily. 09/09/15   Ferman HammingMcKinney, Benjamin, DPM  ?DULoxetine (CYMBALTA) 60 MG capsule Take 60 mg by mouth  daily.    [provider]  ?esomeprazole (NEXIUM) 40 MG capsule Take 40 mg by mouth daily at 12 noon.    [provider]  ?fish oil-omega-3 fatty acids 1000 MG capsule Take 3 g by mouth daily.     [provider]  ?LINZESS 145 MCG CAPS capsule Take 1 capsule by mouth daily. 05/19/14   [provider]  ?lisinopril (PRINIVIL,ZESTRIL) 10 MG tablet Take 10 mg by mouth daily.    [provider]  ?loratadine (CLARITIN) 10 MG tablet Take 10 mg by mouth daily.    [provider]  ?magnesium oxide (MAG-OX) 400 MG tablet Take 400 mg by mouth 2 (two) times daily.    [provider]  ?Melatonin 5 MG CAPS Take 1 capsule by mouth at bedtime.    [provider]  ?meperidine (DEMEROL) 50 MG tablet Take 1 tablet (50 mg total) by mouth every 4 (four) hours as needed for severe pain. 09/09/15   Ferman HammingMcKinney, Benjamin, DPM  ?metFORMIN (GLUCOPHAGE-XR) 500 MG 24 hr tablet Take 1 tablet by mouth 2 (two) times daily. 04/21/14   [provider]  ?multivitamin (METANX) 3-35-2 MG TABS tablet Take 1 tablet by mouth daily.    [provider]  ?PENNSAID 2 % SOLN Place 2 Pump onto the skin 2 (two) times daily. 06/21/18   Tyrell AntonioNewton, Frederic, MD  ?promethazine (  PHENERGAN) 12.5 MG tablet Take 1 tablet (12.5 mg total) by mouth every 6 (six) hours as needed for nausea or vomiting. 09/09/15   Ferman Hamming, DPM  ?sitaGLIPtin (JANUVIA) 100 MG tablet Take 100 mg by mouth daily.    [provider]  ?topiramate (TOPAMAX) 100 MG tablet Take 100 mg by mouth 2 (two) times daily.    [provider]  ?traMADol (ULTRAM) 50 MG tablet TAKE 1 TO 2 TABLETS BY MOUTH TWICE A DAY AS NEEDED 03/28/18   [provider]  ?traZODone (DESYREL) 50 MG tablet TAKE 1 TABLET BY MOUTH AT BEDTIME AS NEEDED MAY INCREASE TO 2 TABLETS IF NEEDED 05/11/18   [provider]  ?   ? ?Allergies    ?Augmentin [amoxicillin-pot clavulanate], Codeine, Coumadin [warfarin  sodium], and Penicillins   ? ?Review of Systems   ?Review of Systems  ?Constitutional:  Negative for chills and fever.  ?Cardiovascular:  Negative for leg swelling.  ?Musculoskeletal:  Positive for arthralgias.  ?Skin:  Negative for color change.  ? ?Physical Exam ?Updated Vital Signs ?BP 129/87 (BP Location: Right Arm)   Pulse 70   Temp 97.7 ?F (36.5 ?C) (Oral)   Resp 17   Ht 1.651 m (5\' 5" )   Wt 84.4 kg   SpO2 100%   BMI 30.95 kg/m?  ?Physical Exam ?CONSTITUTIONAL: Well developed/well nourished, anxious ?HEAD: Normocephalic/atraumatic ?EYES: EOMI ?ENMT: Mucous membranes moist ?NECK: supple no meningeal signs ?LUNGS: no apparent distress ?NEURO: Pt is awake/alert/appropriate, moves all extremitiesx4.  No facial droop.  Patient is able move the left foot without difficulty.  Distal sensation and ?EXTREMITIES: Strong pulse noted in the left foot.  The foot is warm to touch ? there is no erythema or abscess.  No crepitus.  No edema.  She has point tenderness to the left first MTP.  There is a bunion in place.  No significant deformities to the left foot.  There are no wounds to the plantar surface of the foot or in the webspace ?SKIN: warm, color normal ?PSYCH: Anxious ? ?ED Results / Procedures / Treatments   ?Labs ?(all labs ordered are listed, but only abnormal results are displayed) ?Labs Reviewed - No data to display ? ?EKG ?None ? ?Radiology ?No results found. ? ?Procedures ?Procedures  ? ? ?Medications Ordered in ED ?Medications  ?HYDROmorphone (DILAUDID) injection 2 mg (2 mg Intramuscular Given 11/17/21 0010)  ? ? ?ED Course/ Medical Decision Making/ A&P ?Clinical Course as of 11/17/21 0027  ?Tue Nov 16, 2021  ?2347 Patient reports she is having symptoms of neuropathy in the left foot.  Overall her foot is well-perfused, no signs of septic joint/cellulitis or abscess.  No signs of any trauma.  The pain is focal over the left first MTP.  She denies any history of gout.  She reports she took OxyContin at  home without any relief. [DW]  ?2348 Plan to treat pain and discharge.  She can call her podiatrist in the morning.  No indication for imaging as there is no signs of trauma I have very low suspicion for a septic arthritis [DW]  ?Wed Nov 17, 2021  ?0026 Patient improved, ambulatory.  She is requesting discharge home.  She will call her podiatrist in the morning [DW]  ?  ?Clinical Course User Index ?[DW] 0027, MD  ? ?                        ?Medical Decision Making ?Risk ?  Prescription drug management. ? ? ?This patient presents to the ED for concern of left foot pain, this involves an extensive number of treatment options, and is a complaint that carries with it a high risk of complications and morbidity.  The differential diagnosis includes but is not limited to gout, neuropathy, cellulitis, septic joint, abscess, vascular occlusion ? ?Comorbidities that complicate the patient evaluation: ?Patient?s presentation is complicated by their history of diabetes ? ? ? ?Additional history obtained: ? ?Records reviewed previous admission documents ? ? ?Medicines ordered and prescription drug management: ?I ordered medication including Dilaudid for pain ?Reevaluation of the patient after these medicines showed that the patient    improved ? ?Reevaluation: ?After the interventions noted above, I reevaluated the patient and found that they have :improved ? ?Complexity of problems addressed: ?Patient?s presentation is most consistent with  acute presentation with potential threat to life or bodily function ? ?Disposition: ?After consideration of the diagnostic results and the patient?s response to treatment,  ?I feel that the patent would benefit from discharge   .  ? ? ? ? ? ? ? ? ? ?Final Clinical Impression(s) / ED Diagnoses ?Final diagnoses:  ?Left foot pain  ? ? ?Rx / DC Orders ?ED Discharge Orders   ? ? None  ? ?  ? ? ?  ?Zadie Rhine, MD ?11/17/21 6834 ? ?

## 2022-06-27 ENCOUNTER — Telehealth: Payer: Self-pay | Admitting: Physical Medicine and Rehabilitation

## 2022-06-27 NOTE — Telephone Encounter (Signed)
Pt called requesting a call back to set an back injection appt. Please call pt at [phone]

## 2022-06-30 ENCOUNTER — Other Ambulatory Visit: Payer: Self-pay | Admitting: Physical Medicine and Rehabilitation

## 2022-06-30 DIAGNOSIS — M5416 Radiculopathy, lumbar region: Secondary | ICD-10-CM

## 2022-06-30 NOTE — Telephone Encounter (Signed)
Patient called stating her back is starting to bother her again. She stated the last injection she got in February lasted until recently. She has not had any new accidents, falls or injuries.

## 2022-07-14 ENCOUNTER — Ambulatory Visit: Payer: BC Managed Care – PPO | Admitting: Physical Medicine and Rehabilitation

## 2022-07-14 ENCOUNTER — Ambulatory Visit: Payer: Self-pay

## 2022-07-14 VITALS — BP 116/76 | HR 65

## 2022-07-14 DIAGNOSIS — M5416 Radiculopathy, lumbar region: Secondary | ICD-10-CM | POA: Diagnosis not present

## 2022-07-14 MED ORDER — METHYLPREDNISOLONE ACETATE 80 MG/ML IJ SUSP
80.0000 mg | Freq: Once | INTRAMUSCULAR | Status: AC
  Administered 2022-07-14: 80 mg

## 2022-07-14 NOTE — Progress Notes (Signed)
Numeric Pain Rating Scale and Functional Assessment Average Pain 8   In the last MONTH (on 0-10 scale) has pain interfered with the following?  1. General activity like being  able to carry out your everyday physical activities such as walking, climbing stairs, carrying groceries, or moving a chair?  Rating(9)   +Driver, -BT, -Dye Allergies.  Lower back pain with radiation in the the buttocks on the right

## 2022-07-14 NOTE — Procedures (Signed)
S1 Lumbosacral Transforaminal Epidural Steroid Injection - Sub-Pedicular Approach with Fluoroscopic Guidance   Patient: Natalie Gray      Date of Birth: 08-13-1955 MRN: [id number] PCP: Merri Brunette, MD      Visit Date: 07/14/2022   Universal Protocol:    Date/Time: 12/14/238:59 AM  Consent Given By: the patient  Position:  PRONE  Additional Comments: Vital signs were monitored before and after the procedure. Patient was prepped and draped in the usual sterile fashion. The correct patient, procedure, and site was verified.   Injection Procedure Details:  Procedure Site One Meds Administered:  Meds ordered this encounter  Medications   methylPREDNISolone acetate (DEPO-MEDROL) injection 80 mg    Laterality: Left  Location/Site:  S1 Foramen   Needle size: 22 ga.  Needle type: Spinal  Needle Placement: Transforaminal  Findings:   -Comments: Excellent flow of contrast along the nerve, nerve root and into the epidural space.  Epidurogram: Contrast epidurogram showed no nerve root cut off or restricted flow pattern.  Procedure Details: After squaring off the sacral end-plate to get a true AP view, the C-arm was positioned so that the best possible view of the S1 foramen was visualized. The soft tissues overlying this structure were infiltrated with 2-3 ml. of 1% Lidocaine without Epinephrine.    The spinal needle was inserted toward the target using a "trajectory" view along the fluoroscope beam.  Under AP and lateral visualization, the needle was advanced so it did not puncture dura. Biplanar projections were used to confirm position. Aspiration was confirmed to be negative for CSF and/or blood. A 1-2 ml. volume of Isovue-250 was injected and flow of contrast was noted at each level. Radiographs were obtained for documentation purposes.   After attaining the desired flow of contrast documented above, a 0.5 to 1.0 ml test dose of 0.25% Marcaine was injected into each  respective transforaminal space.  The patient was observed for 90 seconds post injection.  After no sensory deficits were reported, and normal lower extremity motor function was noted,   the above injectate was administered so that equal amounts of the injectate were placed at each foramen (level) into the transforaminal epidural space.   Additional Comments:  The patient tolerated the procedure well Dressing: Band-Aid with 2 x 2 sterile gauze    Post-procedure details: Patient was observed during the procedure. Post-procedure instructions were reviewed.  Patient left the clinic in stable condition.`

## 2022-07-14 NOTE — Patient Instructions (Signed)

## 2022-07-20 NOTE — Progress Notes (Signed)
Natalie Gray - 66 y.o. female MRN [id number]  Date of birth: Apr 28, 1956  Office Visit Note: Visit Date: 07/14/2022 PCP: Merri Brunette, MD Referred by: Merri Brunette, MD  Subjective: Chief Complaint  Patient presents with   Lower Back - Pain   HPI:  Natalie Gray is a 66 y.o. female who comes in today for planned repeat Left S1-2  Lumbar Transforaminal epidural steroid injection with fluoroscopic guidance.  The patient has failed conservative care including home exercise, medications, time and activity modification.  This injection will be diagnostic and hopefully therapeutic.  Please see requesting physician notes for further details and justification. Patient received more than 50% pain relief from prior injection.   Will need updated MRI of the lumbar spine prior to any further injection.  Referring: Ellin Goodie, FNP   ROS Otherwise per HPI.  Assessment & Plan: Visit Diagnoses:    ICD-10-CM   1. Lumbar radiculopathy  M54.16 XR C-ARM NO REPORT    Epidural Steroid injection    methylPREDNISolone acetate (DEPO-MEDROL) injection 80 mg      Plan: No additional findings.   Meds & Orders:  Meds ordered this encounter  Medications   methylPREDNISolone acetate (DEPO-MEDROL) injection 80 mg    Orders Placed This Encounter  Procedures   XR C-ARM NO REPORT   Epidural Steroid injection    Follow-up: Return for visit to requesting provider as needed.   Procedures: No procedures performed  S1 Lumbosacral Transforaminal Epidural Steroid Injection - Sub-Pedicular Approach with Fluoroscopic Guidance   Patient: Natalie Gray      Date of Birth: Nov 24, 1955 MRN: [id number] PCP: Merri Brunette, MD      Visit Date: 07/14/2022   Universal Protocol:    Date/Time: 12/14/238:59 AM  Consent Given By: the patient  Position:  PRONE  Additional Comments: Vital signs were monitored before and after the procedure. Patient was prepped and draped in the usual sterile  fashion. The correct patient, procedure, and site was verified.   Injection Procedure Details:  Procedure Site One Meds Administered:  Meds ordered this encounter  Medications   methylPREDNISolone acetate (DEPO-MEDROL) injection 80 mg    Laterality: Left  Location/Site:  S1 Foramen   Needle size: 22 ga.  Needle type: Spinal  Needle Placement: Transforaminal  Findings:   -Comments: Excellent flow of contrast along the nerve, nerve root and into the epidural space.  Epidurogram: Contrast epidurogram showed no nerve root cut off or restricted flow pattern.  Procedure Details: After squaring off the sacral end-plate to get a true AP view, the C-arm was positioned so that the best possible view of the S1 foramen was visualized. The soft tissues overlying this structure were infiltrated with 2-3 ml. of 1% Lidocaine without Epinephrine.    The spinal needle was inserted toward the target using a "trajectory" view along the fluoroscope beam.  Under AP and lateral visualization, the needle was advanced so it did not puncture dura. Biplanar projections were used to confirm position. Aspiration was confirmed to be negative for CSF and/or blood. A 1-2 ml. volume of Isovue-250 was injected and flow of contrast was noted at each level. Radiographs were obtained for documentation purposes.   After attaining the desired flow of contrast documented above, a 0.5 to 1.0 ml test dose of 0.25% Marcaine was injected into each respective transforaminal space.  The patient was observed for 90 seconds post injection.  After no sensory deficits were reported, and normal lower extremity motor function was noted,  the above injectate was administered so that equal amounts of the injectate were placed at each foramen (level) into the transforaminal epidural space.   Additional Comments:  The patient tolerated the procedure well Dressing: Band-Aid with 2 x 2 sterile gauze    Post-procedure  details: Patient was observed during the procedure. Post-procedure instructions were reviewed.  Patient left the clinic in stable condition.`    Clinical History: No specialty comments available.     Objective:  VS:  HT:    WT:   BMI:     BP:116/76  HR:65bpm  TEMP: ( )  RESP:  Physical Exam Vitals and nursing note reviewed.  Constitutional:      General: She is not in acute distress.    Appearance: Normal appearance. She is not ill-appearing.  HENT:     Head: Normocephalic and atraumatic.     Right Ear: External ear normal.     Left Ear: External ear normal.  Eyes:     Extraocular Movements: Extraocular movements intact.  Cardiovascular:     Rate and Rhythm: Normal rate.     Pulses: Normal pulses.  Pulmonary:     Effort: Pulmonary effort is normal. No respiratory distress.  Abdominal:     General: There is no distension.     Palpations: Abdomen is soft.  Musculoskeletal:        General: Tenderness present.     Cervical back: Neck supple.     Right lower leg: No edema.     Left lower leg: No edema.     Comments: Patient has good distal strength with no pain over the greater trochanters.  No clonus or focal weakness.  Skin:    Findings: No erythema, lesion or rash.  Neurological:     General: No focal deficit present.     Mental Status: She is alert and oriented to person, place, and time.     Sensory: No sensory deficit.     Motor: No weakness or abnormal muscle tone.     Coordination: Coordination normal.  Psychiatric:        Mood and Affect: Mood normal.        Behavior: Behavior normal.      Imaging: No results found.

## 2022-12-19 ENCOUNTER — Telehealth: Payer: Self-pay | Admitting: Physical Medicine and Rehabilitation

## 2022-12-19 DIAGNOSIS — M5416 Radiculopathy, lumbar region: Secondary | ICD-10-CM

## 2022-12-19 NOTE — Telephone Encounter (Signed)
Pt called requesting an appt for back injection. Please call pt at [phone].

## 2022-12-22 NOTE — Telephone Encounter (Signed)
Spoke with patient and she is requesting an injection. Last injection in 07/2022 lasted until recently and she had more than 75% relief during that time. No new falls, accidents or injuries. Please advise.

## 2022-12-27 NOTE — Telephone Encounter (Signed)
Spoke with patient and she stated she cannot get an MRI because she has a device over her heart and is about to get a defibrillator placed soon

## 2023-01-23 ENCOUNTER — Ambulatory Visit: Payer: Medicare Other | Admitting: Physical Medicine and Rehabilitation

## 2023-01-23 ENCOUNTER — Encounter: Payer: Self-pay | Admitting: Physical Medicine and Rehabilitation

## 2023-01-23 ENCOUNTER — Other Ambulatory Visit: Payer: Self-pay

## 2023-01-23 VITALS — BP 100/66 | HR 78

## 2023-01-23 DIAGNOSIS — M5416 Radiculopathy, lumbar region: Secondary | ICD-10-CM | POA: Diagnosis not present

## 2023-01-23 DIAGNOSIS — Z959 Presence of cardiac and vascular implant and graft, unspecified: Secondary | ICD-10-CM | POA: Insufficient documentation

## 2023-01-23 MED ORDER — METHYLPREDNISOLONE ACETATE 80 MG/ML IJ SUSP
80.0000 mg | Freq: Once | INTRAMUSCULAR | Status: AC
Start: 2023-01-23 — End: 2023-01-23
  Administered 2023-01-23: 80 mg

## 2023-01-23 NOTE — Patient Instructions (Signed)

## 2023-01-23 NOTE — Progress Notes (Unsigned)
Functional Pain Scale - descriptive words and definitions  Moderate (4)   Constantly aware of pain, can complete ADLs with modification/sleep marginally affected at times/passive distraction is of no use, but active distraction gives some relief. Moderate range order  Average Pain  varies on activity   +Driver, -BT, -Dye Allergies.  Lower back pain on right side that can radiate into the right leg at times

## 2023-01-26 NOTE — Procedures (Signed)
S1 Lumbosacral Transforaminal Epidural Steroid Injection - Sub-Pedicular Approach with Fluoroscopic Guidance   Patient: Natalie Gray      Date of Birth: Dec 03, 1955 MRN: [id number] PCP: Merri Brunette, MD      Visit Date: 01/23/2023   Universal Protocol:    Date/Time: 06/27/249:16 PM  Consent Given By: the patient  Position:  PRONE  Additional Comments: Vital signs were monitored before and after the procedure. Patient was prepped and draped in the usual sterile fashion. The correct patient, procedure, and site was verified.   Injection Procedure Details:  Procedure Site One Meds Administered:  Meds ordered this encounter  Medications   methylPREDNISolone acetate (DEPO-MEDROL) injection 80 mg    Laterality: Bilateral  Location/Site:  S1 Foramen   Needle size: 22 ga.  Needle type: Spinal  Needle Placement: Transforaminal  Findings:   -Comments: Excellent flow of contrast along the nerve, nerve root and into the epidural space.  Epidurogram: Contrast epidurogram showed no nerve root cut off or restricted flow pattern.  Procedure Details: After squaring off the sacral end-plate to get a true AP view, the C-arm was positioned so that the best possible view of the S1 foramen was visualized. The soft tissues overlying this structure were infiltrated with 2-3 ml. of 1% Lidocaine without Epinephrine.    The spinal needle was inserted toward the target using a "trajectory" view along the fluoroscope beam.  Under AP and lateral visualization, the needle was advanced so it did not puncture dura. Biplanar projections were used to confirm position. Aspiration was confirmed to be negative for CSF and/or blood. A 1-2 ml. volume of Isovue-250 was injected and flow of contrast was noted at each level. Radiographs were obtained for documentation purposes.   After attaining the desired flow of contrast documented above, a 0.5 to 1.0 ml test dose of 0.25% Marcaine was injected into  each respective transforaminal space.  The patient was observed for 90 seconds post injection.  After no sensory deficits were reported, and normal lower extremity motor function was noted,   the above injectate was administered so that equal amounts of the injectate were placed at each foramen (level) into the transforaminal epidural space.   Additional Comments:  The patient tolerated the procedure well Dressing: Band-Aid with 2 x 2 sterile gauze    Post-procedure details: Patient was observed during the procedure. Post-procedure instructions were reviewed.  Patient left the clinic in stable condition.

## 2023-01-26 NOTE — Progress Notes (Signed)
Natalie Gray - 67 y.o. female MRN [id number]  Date of birth: Sep 09, 1955  Office Visit Note: Visit Date: 01/23/2023 PCP: Merri Brunette, MD Referred by: Merri Brunette, MD  Subjective: Chief Complaint  Patient presents with   Lower Back - Pain   HPI:  Natalie Gray is a 67 y.o. female who comes in today at the request of Ellin Goodie, FNP for planned Right S1-2 Lumbar Transforaminal epidural steroid injection with fluoroscopic guidance.  The patient has failed conservative care including home exercise, medications, time and activity modification.  This injection will be diagnostic and hopefully therapeutic.  Please see requesting physician notes for further details and justification.   ROS Otherwise per HPI.  Assessment & Plan: Visit Diagnoses:    ICD-10-CM   1. Lumbar radiculopathy  M54.16 XR C-ARM NO REPORT    Epidural Steroid injection    methylPREDNISolone acetate (DEPO-MEDROL) injection 80 mg      Plan: No additional findings.   Meds & Orders:  Meds ordered this encounter  Medications   methylPREDNISolone acetate (DEPO-MEDROL) injection 80 mg    Orders Placed This Encounter  Procedures   XR C-ARM NO REPORT   Epidural Steroid injection    Follow-up: Return for visit to requesting provider as needed.   Procedures: No procedures performed  S1 Lumbosacral Transforaminal Epidural Steroid Injection - Sub-Pedicular Approach with Fluoroscopic Guidance   Patient: Natalie Gray      Date of Birth: 1956/01/26 MRN: [id number] PCP: Merri Brunette, MD      Visit Date: 01/23/2023   Universal Protocol:    Date/Time: 06/27/249:16 PM  Consent Given By: the patient  Position:  PRONE  Additional Comments: Vital signs were monitored before and after the procedure. Patient was prepped and draped in the usual sterile fashion. The correct patient, procedure, and site was verified.   Injection Procedure Details:  Procedure Site One Meds Administered:  Meds ordered  this encounter  Medications   methylPREDNISolone acetate (DEPO-MEDROL) injection 80 mg    Laterality: Bilateral  Location/Site:  S1 Foramen   Needle size: 22 ga.  Needle type: Spinal  Needle Placement: Transforaminal  Findings:   -Comments: Excellent flow of contrast along the nerve, nerve root and into the epidural space.  Epidurogram: Contrast epidurogram showed no nerve root cut off or restricted flow pattern.  Procedure Details: After squaring off the sacral end-plate to get a true AP view, the C-arm was positioned so that the best possible view of the S1 foramen was visualized. The soft tissues overlying this structure were infiltrated with 2-3 ml. of 1% Lidocaine without Epinephrine.    The spinal needle was inserted toward the target using a "trajectory" view along the fluoroscope beam.  Under AP and lateral visualization, the needle was advanced so it did not puncture dura. Biplanar projections were used to confirm position. Aspiration was confirmed to be negative for CSF and/or blood. A 1-2 ml. volume of Isovue-250 was injected and flow of contrast was noted at each level. Radiographs were obtained for documentation purposes.   After attaining the desired flow of contrast documented above, a 0.5 to 1.0 ml test dose of 0.25% Marcaine was injected into each respective transforaminal space.  The patient was observed for 90 seconds post injection.  After no sensory deficits were reported, and normal lower extremity motor function was noted,   the above injectate was administered so that equal amounts of the injectate were placed at each foramen (level) into the transforaminal epidural space.  Additional Comments:  The patient tolerated the procedure well Dressing: Band-Aid with 2 x 2 sterile gauze    Post-procedure details: Patient was observed during the procedure. Post-procedure instructions were reviewed.  Patient left the clinic in stable condition.   Clinical  History: No specialty comments available.     Objective:  VS:  HT:    WT:   BMI:     BP:100/66  HR:78bpm  TEMP: ( )  RESP:  Physical Exam Vitals and nursing note reviewed.  Constitutional:      General: She is not in acute distress.    Appearance: Normal appearance. She is not ill-appearing.  HENT:     Head: Normocephalic and atraumatic.     Right Ear: External ear normal.     Left Ear: External ear normal.  Eyes:     Extraocular Movements: Extraocular movements intact.  Cardiovascular:     Rate and Rhythm: Normal rate.     Pulses: Normal pulses.  Pulmonary:     Effort: Pulmonary effort is normal. No respiratory distress.  Abdominal:     General: There is no distension.     Palpations: Abdomen is soft.  Musculoskeletal:        General: Tenderness present.     Cervical back: Neck supple.     Right lower leg: No edema.     Left lower leg: No edema.     Comments: Patient has good distal strength with no pain over the greater trochanters.  No clonus or focal weakness.  Skin:    Findings: No erythema, lesion or rash.  Neurological:     General: No focal deficit present.     Mental Status: She is alert and oriented to person, place, and time.     Sensory: No sensory deficit.     Motor: No weakness or abnormal muscle tone.     Coordination: Coordination normal.  Psychiatric:        Mood and Affect: Mood normal.        Behavior: Behavior normal.      Imaging: No results found.

## 2023-10-07 ENCOUNTER — Encounter (HOSPITAL_COMMUNITY): Payer: Self-pay

## 2023-10-07 ENCOUNTER — Other Ambulatory Visit: Payer: Self-pay

## 2023-10-07 ENCOUNTER — Emergency Department (HOSPITAL_COMMUNITY)
Admission: EM | Admit: 2023-10-07 | Discharge: 2023-10-07 | Disposition: A | Discharge status: Home / Self Care | Attending: Emergency Medicine | Admitting: Emergency Medicine

## 2023-10-07 DIAGNOSIS — Z7984 Long term (current) use of oral hypoglycemic drugs: Secondary | ICD-10-CM | POA: Diagnosis not present

## 2023-10-07 DIAGNOSIS — E1140 Type 2 diabetes mellitus with diabetic neuropathy, unspecified: Secondary | ICD-10-CM | POA: Diagnosis not present

## 2023-10-07 DIAGNOSIS — Z794 Long term (current) use of insulin: Secondary | ICD-10-CM | POA: Insufficient documentation

## 2023-10-07 DIAGNOSIS — G5791 Unspecified mononeuropathy of right lower limb: Secondary | ICD-10-CM

## 2023-10-07 DIAGNOSIS — M79671 Pain in right foot: Secondary | ICD-10-CM | POA: Diagnosis present

## 2023-10-07 MED ORDER — DEXAMETHASONE SODIUM PHOSPHATE 10 MG/ML IJ SOLN
10.0000 mg | Freq: Once | INTRAMUSCULAR | Status: AC
Start: 2023-10-07 — End: 2023-10-07
  Administered 2023-10-07: 10 mg via INTRAMUSCULAR
  Filled 2023-10-07: qty 1

## 2023-10-07 MED ORDER — GABAPENTIN 300 MG PO CAPS
300.0000 mg | ORAL_CAPSULE | Freq: Once | ORAL | Status: AC
  Administered 2023-10-07: 300 mg via ORAL
  Filled 2023-10-07: qty 1

## 2023-10-07 NOTE — Discharge Instructions (Signed)
 I have given you a medication called gabapentin and an injection of a steroid called Decadron.  This will likely make your blood sugar go up temporarily.  You will need to follow-up with your family doctor for further prescribing of the gabapentin if it works.

## 2023-10-07 NOTE — ED Triage Notes (Signed)
 Pt has neuropathy and stated her right foot has been in burning pain since last night

## 2023-10-07 NOTE — ED Provider Notes (Signed)
 [place] EMERGENCY DEPARTMENT AT [facility] Provider Note   CSN: [id number] Arrival date & time: 10/07/23  1621     History  Chief Complaint  Patient presents with   Foot Pain    Natalie Gray is a 68 y.o. female.   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  She went to the emergency department in [place] last night and was given Toradol without significant relief, she comes in today stating that the pain is still ongoing, there is no swelling redness or fevers.  She has chronic neuropathy in her other leg secondary to her diabetes as well.  She reports that when this has happened in the past she has been given some type of opiate and it helped, the patient takes hydrocodone daily, she takes Xanax for sleep as well.  The patient has medications on her chart listed such as Demerol every 4 hours as needed for severe pain, it is not clear whether she is currently taking this.  Review of the medical record shows that the patient gets 90 tablets of Vicodin regularly, the PDMP database was reviewed.     Home Medications Prior to Admission medications   Medication Sig Start Date End Date Taking? Authorizing Provider  ALPRAZolam Prudy Feeler) 0.25 MG tablet Take 0.25 mg by mouth at bedtime as needed for sleep or anxiety.    [provider]  buPROPion (WELLBUTRIN XL) 150 MG 24 hr tablet Take 150 mg by mouth daily.    [provider]  buPROPion (WELLBUTRIN XL) 300 MG 24 hr tablet Take 300 mg by mouth daily.    [provider]  carvedilol (COREG) 6.25 MG tablet Take 1 tablet by mouth 2 (two)  times daily. 05/20/14   [provider]  celecoxib (CELEBREX) 100 MG capsule Take 100 mg by mouth 2 (two) times daily.    [provider]  cholecalciferol (VITAMIN D) 1000 UNITS tablet Take 1,000 Units by mouth daily.    [provider]  clindamycin (CLEOCIN) 300 MG capsule Take 1 capsule (300 mg total) by mouth 3 (three) times daily. 09/09/15   Ferman Hamming, DPM  DULoxetine (CYMBALTA) 60 MG capsule Take 60 mg by mouth daily.    [provider]  esomeprazole (NEXIUM) 40 MG capsule Take 40 mg by mouth daily at 12 noon.    [provider]  fish oil-omega-3 fatty acids 1000 MG capsule Take 3 g by mouth daily.     [provider]  LINZESS 145 MCG CAPS capsule Take 1 capsule by mouth daily. 05/19/14   [provider]  lisinopril (PRINIVIL,ZESTRIL) 10 MG tablet Take 10 mg by mouth daily.    [provider]  loratadine (CLARITIN) 10 MG tablet Take 10 mg by mouth daily.    [provider]  magnesium oxide (MAG-OX) 400 MG tablet Take 400 mg by mouth 2 (two) times daily.    [provider]  Melatonin 5 MG CAPS Take 1 capsule by mouth at bedtime.    [provider]  meperidine (DEMEROL) 50 MG tablet Take 1 tablet (50 mg total) by mouth every  4 (four) hours as needed for severe pain. 09/09/15   Ferman Hamming, DPM  metFORMIN (GLUCOPHAGE-XR) 500 MG 24 hr tablet Take 1 tablet by mouth 2 (two) times daily. 04/21/14   [provider]  multivitamin (METANX) 3-35-2 MG TABS tablet Take 1 tablet by mouth daily.    [provider]  ondansetron (ZOFRAN-ODT) 4 MG disintegrating tablet Take 4-8 mg by mouth 3 (three) times daily as needed. 07/07/22   [provider]  OZEMPIC, 0.25 OR 0.5 MG/DOSE, 2 MG/3ML SOPN Inject 0.5 mg into the skin once a week. 08/10/22   [provider]  PENNSAID 2 % SOLN Place 2 Pump onto the skin 2 (two) times daily. 06/21/18   Tyrell Antonio, MD  promethazine  (PHENERGAN) 12.5 MG tablet Take 1 tablet (12.5 mg total) by mouth every 6 (six) hours as needed for nausea or vomiting. 09/09/15   Ferman Hamming, DPM  sitaGLIPtin (JANUVIA) 100 MG tablet Take 100 mg by mouth daily.    [provider]  topiramate (TOPAMAX) 100 MG tablet Take 100 mg by mouth 2 (two) times daily.    [provider]  traMADol (ULTRAM) 50 MG tablet TAKE 1 TO 2 TABLETS BY MOUTH TWICE A DAY AS NEEDED 03/28/18   [provider]  traZODone (DESYREL) 50 MG tablet TAKE 1 TABLET BY MOUTH AT BEDTIME AS NEEDED MAY INCREASE TO 2 TABLETS IF NEEDED 05/11/18   [provider]      Allergies    Augmentin [amoxicillin-pot clavulanate], Cefuroxime, Codeine, Triazolam, Coumadin [warfarin sodium], and Penicillins    Review of Systems   Review of Systems  All other systems reviewed and are negative.   Physical Exam Updated Vital Signs BP 107/66   Pulse 80   Temp 98.4 F (36.9 C) (Oral)   Resp 18   Ht 1.651 m (5\' 5" )   Wt 83.9 kg   SpO2 97%   BMI 30.79 kg/m  Physical Exam Vitals and nursing note reviewed.  Constitutional:      General: She is not in acute distress.    Appearance: She is well-developed.  HENT:     Head: Normocephalic and atraumatic.     Mouth/Throat:     Pharynx: No oropharyngeal exudate.  Eyes:     General: No scleral icterus.       Right eye: No discharge.        Left eye: No discharge.     Conjunctiva/sclera: Conjunctivae normal.     Pupils: Pupils are equal, round, and reactive to light.  Neck:     Thyroid: No thyromegaly.     Vascular: No JVD.  Cardiovascular:     Rate and Rhythm: Normal rate and regular rhythm.     Heart sounds: Normal heart sounds. No murmur heard.    No friction rub. No gallop.  Pulmonary:     Effort: Pulmonary effort is normal. No respiratory distress.     Breath sounds: Normal breath sounds. No wheezing or rales.  Abdominal:     General: Bowel sounds are normal. There is no distension.      Palpations: Abdomen is soft. There is no mass.     Tenderness: There is no abdominal tenderness.  Musculoskeletal:        General: No tenderness. Normal range of motion.     Cervical back: Normal range of motion and neck supple.     Right lower leg: No edema.     Left lower leg: No edema.  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  The foot appears warm, it is not pale or cold  Lymphadenopathy:     Cervical: No cervical adenopathy.  Skin:    General: Skin is warm and dry.     Findings: No erythema or rash.  Neurological:     Mental Status: She is alert.     Coordination: Coordination normal.  Psychiatric:        Behavior: Behavior normal.     ED Results / Procedures / Treatments   Labs (all labs ordered are listed, but only abnormal results are displayed) Labs Reviewed - No data to display  EKG None  Radiology No results found.  Procedures Procedures    Medications Ordered in ED Medications  gabapentin (NEURONTIN) capsule 300 mg (300 mg Oral Given 10/07/23 1725)  dexamethasone (DECADRON) injection 10 mg (10 mg Intramuscular Given 10/07/23 1724)    ED Course/ Medical Decision Making/ A&P                                 Medical Decision Making Risk Prescription drug management.   Overall this patient presents with what appears to be an acute neuropathy, this is acute on chronic, she has had this multiple times in the past, this seems similar to those episodes.  Vital signs are unremarkable, she is not febrile or tachycardic, she is not hypotensive, there is no signs of sepsis, no signs of DVT.  The patient was offered Toradol, she declined since she  had a last night.  I will give her a shot of Decadron and start her on some gabapentin.  She is already on opiates and per the medical record benzos as well.  I do not feel comfortable giving the patient more opiates on top of this.  The patient is agreeable to the plan, she can follow-up in the outpatient setting        Final Clinical Impression(s) / ED Diagnoses Final diagnoses:  Neuropathy of right foot    Rx / DC Orders ED Discharge Orders     None         Eber Hong, MD 10/07/23 2249

## 2024-06-11 ENCOUNTER — Other Ambulatory Visit: Payer: Self-pay

## 2024-06-11 ENCOUNTER — Encounter (HOSPITAL_COMMUNITY): Payer: Self-pay

## 2024-06-11 ENCOUNTER — Emergency Department (HOSPITAL_COMMUNITY)
Admission: EM | Admit: 2024-06-11 | Discharge: 2024-06-12 | Disposition: A | Discharge status: Home / Self Care | Attending: Emergency Medicine | Admitting: Emergency Medicine

## 2024-06-11 DIAGNOSIS — I10 Essential (primary) hypertension: Secondary | ICD-10-CM | POA: Diagnosis not present

## 2024-06-11 DIAGNOSIS — E119 Type 2 diabetes mellitus without complications: Secondary | ICD-10-CM | POA: Insufficient documentation

## 2024-06-11 DIAGNOSIS — Z79899 Other long term (current) drug therapy: Secondary | ICD-10-CM | POA: Diagnosis not present

## 2024-06-11 DIAGNOSIS — M79671 Pain in right foot: Secondary | ICD-10-CM | POA: Diagnosis present

## 2024-06-11 DIAGNOSIS — Z7984 Long term (current) use of oral hypoglycemic drugs: Secondary | ICD-10-CM | POA: Insufficient documentation

## 2024-06-11 MED ORDER — KETOROLAC TROMETHAMINE 60 MG/2ML IM SOLN
30.0000 mg | Freq: Once | INTRAMUSCULAR | Status: AC
  Administered 2024-06-12: 30 mg via INTRAMUSCULAR
  Filled 2024-06-11: qty 2

## 2024-06-11 MED ORDER — GABAPENTIN 100 MG PO CAPS
100.0000 mg | ORAL_CAPSULE | Freq: Once | ORAL | Status: AC
  Administered 2024-06-12: 100 mg via ORAL
  Filled 2024-06-11: qty 1

## 2024-06-11 MED ORDER — OXYCODONE-ACETAMINOPHEN 5-325 MG PO TABS
1.0000 | ORAL_TABLET | Freq: Once | ORAL | Status: AC
  Administered 2024-06-12: 1 via ORAL
  Filled 2024-06-11: qty 1

## 2024-06-11 MED ORDER — DEXAMETHASONE SOD PHOSPHATE PF 10 MG/ML IJ SOLN
10.0000 mg | Freq: Once | INTRAMUSCULAR | Status: AC
  Administered 2024-06-12: 10 mg via INTRAMUSCULAR

## 2024-06-11 NOTE — ED Triage Notes (Signed)
 Pt to ED from home with c/o foot pain, pt with hx of chronic pain to feet d/t neuropathy, pt says she took her last oxycodone last night. Pt ambulatory, pt says she doesn't have any toes on right foot from diabetes, mid foot amputation.

## 2024-06-11 NOTE — ED Provider Notes (Signed)
 [place] EMERGENCY DEPARTMENT AT [facility] Provider Note   CSN: [id number] Arrival date & time: 06/11/24  2138     Patient presents with: Foot Pain   Natalie Gray is a 68 y.o. female.  {Add pertinent medical, surgical, social history, OB history to YEP:67052}  Foot Pain  Patient presents for right foot pain.  Medical history includes anxiety, depression, GERD, DM, arthritis, hypertension, neuropathy, PE.  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  She is followed by Dr. Blinda.  She has had chronic intermittent episodes of severe neuropathy.  She was last seen in the ED for this in March.  She was treated with intramuscular Decadron  which did give her relief.  She no longer takes chronic narcotics.  She did have an oxycodone leftover from prior prescription.  She did take this yesterday with minimal relief.  Pain is described as lancinating pains into her right amputation site.  This is consistent with prior episodes of neuropathic pain flares.     Prior to Admission medications   Medication Sig Start Date End Date Taking? Authorizing Provider  ALPRAZolam (XANAX) 0.25 MG tablet Take 0.25 mg by mouth at bedtime as needed for sleep or anxiety.    [provider]  buPROPion (WELLBUTRIN XL) 150 MG 24 hr tablet Take 150 mg by mouth daily.    [provider]  buPROPion (WELLBUTRIN XL) 300 MG 24 hr tablet Take 300 mg by mouth daily.    [provider]  carvedilol (COREG) 6.25 MG tablet Take 1 tablet by mouth 2 (two) times daily. 05/20/14   [provider]  celecoxib (CELEBREX) 100 MG capsule Take 100 mg by mouth 2 (two) times daily.    [provider]  cholecalciferol (VITAMIN D) 1000 UNITS tablet Take 1,000 Units by mouth daily.    [provider]  clindamycin  (CLEOCIN ) 300 MG capsule Take 1 capsule (300 mg total) by mouth 3 (three) times daily. 09/09/15   Blinda Katz, DPM   DULoxetine (CYMBALTA) 60 MG capsule Take 60 mg by mouth daily.    [provider]  esomeprazole (NEXIUM) 40 MG capsule Take 40 mg by mouth daily at 12 noon.    [provider]  fish oil-omega-3 fatty acids 1000 MG capsule Take 3 g by mouth daily.     [provider]  LINZESS 145 MCG CAPS capsule Take 1 capsule by mouth daily. 05/19/14   [provider]  lisinopril (PRINIVIL,ZESTRIL) 10 MG tablet Take 10 mg by mouth daily.    [provider]  loratadine (CLARITIN) 10 MG tablet Take 10 mg by mouth daily.    [provider]  magnesium oxide (MAG-OX) 400 MG tablet Take 400 mg by mouth 2 (two) times daily.    [provider]  Melatonin 5 MG CAPS Take 1 capsule by mouth at bedtime.    [provider]  meperidine  (DEMEROL ) 50 MG tablet Take 1 tablet (50 mg total) by mouth every 4 (four) hours as needed for severe pain. 09/09/15   Blinda Katz, DPM  metFORMIN (GLUCOPHAGE-XR) 500 MG 24 hr tablet Take 1 tablet by mouth 2 (two) times daily. 04/21/14   [provider]  multivitamin (METANX) 3-35-2 MG TABS tablet Take 1 tablet by mouth daily.    [provider]  ondansetron  (ZOFRAN -ODT) 4 MG disintegrating tablet Take 4-8 mg by mouth 3 (three) times daily as needed. 07/07/22   [provider]  OZEMPIC,  0.25 OR 0.5 MG/DOSE, 2 MG/3ML SOPN Inject 0.5 mg into the skin once a week. 08/10/22   [provider]  PENNSAID  2 % SOLN Place 2 Pump onto the skin 2 (two) times daily. 06/21/18   Eldonna Novel, MD  promethazine  (PHENERGAN ) 12.5 MG tablet Take 1 tablet (12.5 mg total) by mouth every 6 (six) hours as needed for nausea or vomiting. 09/09/15   Blinda Katz, DPM  sitaGLIPtin (JANUVIA) 100 MG tablet Take 100 mg by mouth daily.    [provider]  topiramate (TOPAMAX) 100 MG tablet Take 100 mg by mouth 2 (two) times daily.    [provider]  traMADol (ULTRAM) 50 MG tablet TAKE 1 TO 2  TABLETS BY MOUTH TWICE A DAY AS NEEDED 03/28/18   [provider]  traZODone (DESYREL) 50 MG tablet TAKE 1 TABLET BY MOUTH AT BEDTIME AS NEEDED MAY INCREASE TO 2 TABLETS IF NEEDED 05/11/18   [provider]    Allergies: Augmentin [amoxicillin-pot clavulanate], Cefuroxime, Codeine, Triazolam, Coumadin [warfarin sodium], and Penicillins    Review of Systems  Musculoskeletal:        Right anterior foot pain  All other systems reviewed and are negative.   Updated Vital Signs BP 128/72   Pulse 90   Temp 97.8 F (36.6 C) (Temporal)   Resp 18   Ht 5' 5 (1.651 m)   Wt 83.9 kg   SpO2 95%   BMI 30.78 kg/m   Physical Exam Vitals and nursing note reviewed.  Constitutional:      General: She is not in acute distress.    Appearance: Normal appearance. She is well-developed. She is not ill-appearing, toxic-appearing or diaphoretic.  HENT:     Head: Normocephalic and atraumatic.     Right Ear: External ear normal.     Left Ear: External ear normal.     Nose: Nose normal.     Mouth/Throat:     Mouth: Mucous membranes are moist.  Eyes:     Extraocular Movements: Extraocular movements intact.     Conjunctiva/sclera: Conjunctivae normal.  Cardiovascular:     Rate and Rhythm: Normal rate and regular rhythm.  Pulmonary:     Effort: Pulmonary effort is normal. No respiratory distress.  Abdominal:     General: Abdomen is flat. There is no distension.  Musculoskeletal:        General: No swelling. Normal range of motion.     Cervical back: Normal range of motion and neck supple.     Comments: No erythema, warmth, poikilothermia, tenderness, or swelling to area of pain.  No calf tenderness.  Skin:    General: Skin is warm and dry.     Coloration: Skin is not jaundiced or pale.  Neurological:     General: No focal deficit present.     Mental Status: She is alert and oriented to person, place, and time.  Psychiatric:        Mood and Affect: Mood normal.         Behavior: Behavior normal.     (all labs ordered are listed, but only abnormal results are displayed) Labs Reviewed - No data to display  EKG: None  Radiology: No results found.  {Document cardiac monitor, telemetry assessment procedure when appropriate:32947} Procedures   Medications Ordered in the ED - No data to display    {Click here for ABCD2, HEART and other calculators REFRESH Note before signing:1}  Medical Decision Making  Patient with history of partial right foot amputation, presenting for worsened pain in area of prior amputation.  She states that she will get intermittent episodes of what she describes as a lancinating neuropathic pain.  Although she has been prescribed gabapentin  in the past, no longer takes this.  She no longer takes chronic opiates.  She does take Cymbalta.  She did take 1 leftover oxycodone yesterday.  She presents to the ED due to ongoing pain.  Patient was last seen in the ED for this in March.  She states that she was given a shot which was very helpful.  Per chart review, this was Decadron .  Dose was ordered today.  Further multimodal pain control ordered.***  {Document critical care time when appropriate  Document review of labs and clinical decision tools ie CHADS2VASC2, etc  Document your independent review of radiology images and any outside records  Document your discussion with family members, caretakers and with consultants  Document social determinants of health affecting pt's care  Document your decision making why or why not admission, treatments were needed:32947:::1}   Final diagnoses:  None    ED Discharge Orders     None

## 2024-06-12 MED ORDER — GABAPENTIN 100 MG PO CAPS
100.0000 mg | ORAL_CAPSULE | Freq: Three times a day (TID) | ORAL | 2 refills | Status: AC
Start: 2024-06-12 — End: ?

## 2024-06-12 NOTE — Discharge Instructions (Addendum)
 A new prescription for gabapentin  was sent to your pharmacy.  Daily use of this can help with neuropathic pain.  You can increase dose over time if initial dose is not effective.  Follow-up with your PCP about this.
